# Patient Record
Sex: Female | Born: 1955 | Race: Black or African American | Hispanic: No | Marital: Married | Smoking: Former smoker
Health system: Southern US, Community
[De-identification: ages and names within clinical notes are randomized; demographics above are authoritative.]

## PROBLEM LIST (undated history)

## (undated) DIAGNOSIS — M797 Fibromyalgia: Secondary | ICD-10-CM

## (undated) DIAGNOSIS — K219 Gastro-esophageal reflux disease without esophagitis: Secondary | ICD-10-CM

## (undated) DIAGNOSIS — D689 Coagulation defect, unspecified: Secondary | ICD-10-CM

## (undated) DIAGNOSIS — E785 Hyperlipidemia, unspecified: Secondary | ICD-10-CM

## (undated) DIAGNOSIS — F419 Anxiety disorder, unspecified: Secondary | ICD-10-CM

## (undated) DIAGNOSIS — R1010 Upper abdominal pain, unspecified: Secondary | ICD-10-CM

## (undated) DIAGNOSIS — F32A Depression, unspecified: Secondary | ICD-10-CM

## (undated) DIAGNOSIS — M1990 Unspecified osteoarthritis, unspecified site: Secondary | ICD-10-CM

## (undated) DIAGNOSIS — I2699 Other pulmonary embolism without acute cor pulmonale: Secondary | ICD-10-CM

## (undated) DIAGNOSIS — F329 Major depressive disorder, single episode, unspecified: Secondary | ICD-10-CM

## (undated) DIAGNOSIS — H269 Unspecified cataract: Secondary | ICD-10-CM

## (undated) DIAGNOSIS — G709 Myoneural disorder, unspecified: Secondary | ICD-10-CM

## (undated) HISTORY — PX: UPPER GI ENDOSCOPY: SHX6162

## (undated) HISTORY — DX: Depression, unspecified: F32.A

## (undated) HISTORY — PX: TONSILLECTOMY: SUR1361

## (undated) HISTORY — DX: Fibromyalgia: M79.7

## (undated) HISTORY — DX: Unspecified cataract: H26.9

## (undated) HISTORY — PX: POLYPECTOMY: SHX149

## (undated) HISTORY — DX: Major depressive disorder, single episode, unspecified: F32.9

## (undated) HISTORY — DX: Myoneural disorder, unspecified: G70.9

## (undated) HISTORY — DX: Anxiety disorder, unspecified: F41.9

## (undated) HISTORY — DX: Upper abdominal pain, unspecified: R10.10

## (undated) HISTORY — PX: COLONOSCOPY: SHX174

## (undated) HISTORY — PX: BACK SURGERY: SHX140

## (undated) HISTORY — PX: ABDOMINAL HYSTERECTOMY: SHX81

## (undated) HISTORY — PX: UPPER GASTROINTESTINAL ENDOSCOPY: SHX188

## (undated) HISTORY — PX: BUNIONECTOMY: SHX129

## (undated) HISTORY — DX: Unspecified osteoarthritis, unspecified site: M19.90

## (undated) HISTORY — DX: Gastro-esophageal reflux disease without esophagitis: K21.9

## (undated) HISTORY — DX: Hyperlipidemia, unspecified: E78.5

## (undated) HISTORY — DX: Other pulmonary embolism without acute cor pulmonale: I26.99

## (undated) HISTORY — DX: Coagulation defect, unspecified: D68.9

---

## 1998-05-02 ENCOUNTER — Emergency Department (HOSPITAL_COMMUNITY): Admission: EM | Admit: 1998-05-02 | Discharge: 1998-05-02 | Payer: Self-pay | Admitting: Emergency Medicine

## 1998-05-03 ENCOUNTER — Encounter: Admission: RE | Admit: 1998-05-03 | Discharge: 1998-05-03 | Payer: Self-pay | Admitting: Internal Medicine

## 1998-05-05 ENCOUNTER — Emergency Department (HOSPITAL_COMMUNITY): Admission: EM | Admit: 1998-05-05 | Discharge: 1998-05-05 | Payer: Self-pay | Admitting: Emergency Medicine

## 1998-05-07 ENCOUNTER — Emergency Department (HOSPITAL_COMMUNITY): Admission: EM | Admit: 1998-05-07 | Discharge: 1998-05-07 | Payer: Self-pay | Admitting: Emergency Medicine

## 1998-06-28 ENCOUNTER — Inpatient Hospital Stay (HOSPITAL_COMMUNITY): Admission: RE | Admit: 1998-06-28 | Discharge: 1998-06-29 | Payer: Self-pay | Admitting: Neurosurgery

## 1998-06-28 ENCOUNTER — Encounter: Payer: Self-pay | Admitting: Neurosurgery

## 1998-07-22 ENCOUNTER — Ambulatory Visit (HOSPITAL_COMMUNITY): Admission: RE | Admit: 1998-07-22 | Discharge: 1998-07-22 | Payer: Self-pay | Admitting: Neurosurgery

## 1998-07-22 ENCOUNTER — Encounter: Payer: Self-pay | Admitting: Neurosurgery

## 1998-08-22 ENCOUNTER — Encounter: Payer: Self-pay | Admitting: Neurosurgery

## 1998-08-22 ENCOUNTER — Ambulatory Visit (HOSPITAL_COMMUNITY): Admission: RE | Admit: 1998-08-22 | Discharge: 1998-08-22 | Payer: Self-pay | Admitting: Neurosurgery

## 2000-08-08 ENCOUNTER — Encounter: Payer: Self-pay | Admitting: Emergency Medicine

## 2000-08-08 ENCOUNTER — Emergency Department (HOSPITAL_COMMUNITY): Admission: EM | Admit: 2000-08-08 | Discharge: 2000-08-08 | Payer: Self-pay | Admitting: Emergency Medicine

## 2009-01-31 ENCOUNTER — Encounter: Admission: RE | Admit: 2009-01-31 | Discharge: 2009-01-31 | Payer: Self-pay | Admitting: Neurosurgery

## 2009-02-15 ENCOUNTER — Ambulatory Visit (HOSPITAL_COMMUNITY): Admission: RE | Admit: 2009-02-15 | Discharge: 2009-02-15 | Payer: Self-pay | Admitting: Neurosurgery

## 2010-12-21 DIAGNOSIS — I2699 Other pulmonary embolism without acute cor pulmonale: Secondary | ICD-10-CM

## 2010-12-21 HISTORY — DX: Other pulmonary embolism without acute cor pulmonale: I26.99

## 2011-01-03 DIAGNOSIS — D689 Coagulation defect, unspecified: Secondary | ICD-10-CM

## 2011-01-03 HISTORY — DX: Coagulation defect, unspecified: D68.9

## 2011-01-06 ENCOUNTER — Observation Stay (HOSPITAL_COMMUNITY)
Admission: EM | Admit: 2011-01-06 | Discharge: 2011-01-08 | Disposition: A | Discharge status: Home / Self Care | Payer: BC Managed Care – PPO | Attending: Internal Medicine | Admitting: Internal Medicine

## 2011-01-06 ENCOUNTER — Emergency Department (HOSPITAL_COMMUNITY): Payer: BC Managed Care – PPO

## 2011-01-06 DIAGNOSIS — F17200 Nicotine dependence, unspecified, uncomplicated: Secondary | ICD-10-CM | POA: Insufficient documentation

## 2011-01-06 DIAGNOSIS — J90 Pleural effusion, not elsewhere classified: Secondary | ICD-10-CM | POA: Insufficient documentation

## 2011-01-06 DIAGNOSIS — I2699 Other pulmonary embolism without acute cor pulmonale: Principal | ICD-10-CM | POA: Insufficient documentation

## 2011-01-06 DIAGNOSIS — R0989 Other specified symptoms and signs involving the circulatory and respiratory systems: Secondary | ICD-10-CM | POA: Insufficient documentation

## 2011-01-06 DIAGNOSIS — R110 Nausea: Secondary | ICD-10-CM | POA: Insufficient documentation

## 2011-01-06 DIAGNOSIS — R0609 Other forms of dyspnea: Secondary | ICD-10-CM | POA: Insufficient documentation

## 2011-01-06 DIAGNOSIS — F411 Generalized anxiety disorder: Secondary | ICD-10-CM | POA: Insufficient documentation

## 2011-01-06 LAB — CBC
HCT: 38.8 % (ref 36.0–46.0)
Hemoglobin: 12.5 g/dL (ref 12.0–15.0)
MCV: 90 fL (ref 78.0–100.0)
Platelets: 166 10*3/uL (ref 150–400)
RBC: 4.31 MIL/uL (ref 3.87–5.11)
WBC: 7.9 10*3/uL (ref 4.0–10.5)

## 2011-01-06 LAB — POCT CARDIAC MARKERS
CKMB, poc: <1 ng/mL — ABNORMAL LOW (ref 1.0–8.0)
Troponin i, poc: <0.05 ng/mL (ref 0.00–0.09)

## 2011-01-06 LAB — DIFFERENTIAL
Eosinophils Absolute: 0.1 10*3/uL (ref 0.0–0.7)
Lymphocytes Relative: 23 % (ref 12–46)
Lymphs Abs: 1.8 10*3/uL (ref 0.7–4.0)
Neutro Abs: 5.5 10*3/uL (ref 1.7–7.7)
Neutrophils Relative %: 70 % (ref 43–77)

## 2011-01-06 LAB — COMPREHENSIVE METABOLIC PANEL
Albumin: 3.3 g/dL — ABNORMAL LOW (ref 3.5–5.2)
Alkaline Phosphatase: 71 U/L (ref 39–117)
BUN: 10 mg/dL (ref 6–23)
Potassium: 3.7 mEq/L (ref 3.5–5.1)
Total Protein: 6.8 g/dL (ref 6.0–8.3)

## 2011-01-07 ENCOUNTER — Inpatient Hospital Stay (HOSPITAL_COMMUNITY): Payer: BC Managed Care – PPO

## 2011-01-07 DIAGNOSIS — I2699 Other pulmonary embolism without acute cor pulmonale: Secondary | ICD-10-CM

## 2011-01-07 DIAGNOSIS — I517 Cardiomegaly: Secondary | ICD-10-CM

## 2011-01-07 LAB — POCT CARDIAC MARKERS
CKMB, poc: <1 ng/mL — ABNORMAL LOW (ref 1.0–8.0)
Myoglobin, poc: 49.7 ng/mL (ref 12–200)
Troponin i, poc: <0.05 ng/mL (ref 0.00–0.09)

## 2011-01-07 LAB — CARDIAC PANEL(CRET KIN+CKTOT+MB+TROPI)
CK, MB: 0.9 ng/mL (ref 0.3–4.0)
CK, MB: 0.8 ng/mL (ref 0.3–4.0)
CK, MB: 0.7 ng/mL (ref 0.3–4.0)
Relative Index: INVALID (ref 0.0–2.5)
Relative Index: INVALID (ref 0.0–2.5)
Total CK: 80 U/L (ref 7–177)
Total CK: 78 U/L (ref 7–177)
Troponin I: 0.01 ng/mL (ref 0.00–0.06)

## 2011-01-07 LAB — LIPID PANEL
HDL: 42 mg/dL
Total CHOL/HDL Ratio: 3.7 ratio
Triglycerides: 51 mg/dL
VLDL: 10 mg/dL (ref 0–40)

## 2011-01-07 LAB — COMPREHENSIVE METABOLIC PANEL
ALT: 18 U/L (ref 0–35)
CO2: 27 mEq/L (ref 19–32)
Calcium: 9 mg/dL (ref 8.4–10.5)
Creatinine, Ser: 0.75 mg/dL (ref 0.4–1.2)
GFR calc non Af Amer: >60 mL/min (ref >60)
Glucose, Bld: 90 mg/dL (ref 70–99)
Sodium: 139 mEq/L (ref 135–145)
Total Bilirubin: 0.6 mg/dL (ref 0.3–1.2)

## 2011-01-07 LAB — URINALYSIS, ROUTINE W REFLEX MICROSCOPIC
Glucose, UA: NEGATIVE mg/dL
Hgb urine dipstick: NEGATIVE
Specific Gravity, Urine: 1.014 (ref 1.005–1.030)

## 2011-01-07 LAB — CBC
HCT: 40.5 % (ref 36.0–46.0)
Hemoglobin: 13 g/dL (ref 12.0–15.0)
MCH: 29 pg (ref 26.0–34.0)
MCHC: 32.1 g/dL (ref 30.0–36.0)
MCV: 90.2 fL (ref 78.0–100.0)
Platelets: 177 K/uL (ref 150–400)
RBC: 4.49 MIL/uL (ref 3.87–5.11)
RDW: 13.6 % (ref 11.5–15.5)
WBC: 6.3 K/uL (ref 4.0–10.5)

## 2011-01-07 LAB — URINE MICROSCOPIC-ADD ON

## 2011-01-07 LAB — MAGNESIUM: Magnesium: 2.3 mg/dL (ref 1.5–2.5)

## 2011-01-07 LAB — TSH: TSH: 3.034 u[IU]/mL (ref 0.350–4.500)

## 2011-01-07 MED ORDER — IOHEXOL 300 MG/ML  SOLN
100.0000 mL | Freq: Once | INTRAMUSCULAR | Status: AC | PRN
  Administered 2011-01-07: 100 mL via INTRAVENOUS

## 2011-01-08 LAB — DIFFERENTIAL
Basophils Relative: 1 % (ref 0–1)
Lymphocytes Relative: 40 % (ref 12–46)
Lymphs Abs: 2.2 10*3/uL (ref 0.7–4.0)
Monocytes Absolute: 0.4 10*3/uL (ref 0.1–1.0)
Monocytes Relative: 8 % (ref 3–12)
Neutro Abs: 2.8 10*3/uL (ref 1.7–7.7)

## 2011-01-08 LAB — COMPREHENSIVE METABOLIC PANEL
AST: 17 U/L (ref 0–37)
BUN: 8 mg/dL (ref 6–23)
CO2: 28 mEq/L (ref 19–32)
Creatinine, Ser: 0.71 mg/dL (ref 0.4–1.2)
GFR calc non Af Amer: >60 mL/min (ref >60)
Glucose, Bld: 89 mg/dL (ref 70–99)
Potassium: 4.1 mEq/L (ref 3.5–5.1)
Sodium: 141 mEq/L (ref 135–145)
Total Bilirubin: 0.6 mg/dL (ref 0.3–1.2)

## 2011-01-08 LAB — CBC
HCT: 40.2 % (ref 36.0–46.0)
Hemoglobin: 12.9 g/dL (ref 12.0–15.0)
MCH: 29.1 pg (ref 26.0–34.0)
MCHC: 32.1 g/dL (ref 30.0–36.0)
MCV: 90.5 fL (ref 78.0–100.0)
RBC: 4.44 MIL/uL (ref 3.87–5.11)
WBC: 5.7 10*3/uL (ref 4.0–10.5)

## 2011-01-08 LAB — URINE CULTURE: Colony Count: NO GROWTH

## 2011-01-08 LAB — PROTIME-INR: Prothrombin Time: 13.4 seconds (ref 11.6–15.2)

## 2011-01-08 LAB — MAGNESIUM: Magnesium: 2.3 mg/dL (ref 1.5–2.5)

## 2011-01-15 NOTE — Discharge Summary (Signed)
Alexa Simpson, Alexa Simpson                ACCOUNT NO.:  [id number]  MEDICAL RECORD NO.:  [id number]           PATIENT TYPE:  I  LOCATION:  2002                         FACILITY:  [facility]  PHYSICIAN:  Rock Nephew, MD       DATE OF BIRTH:  [birth year]  DATE OF ADMISSION:  01/06/2011 DATE OF DISCHARGE:  01/08/2011                        DISCHARGE SUMMARY - REFERRING   The patient does not have a primary care physician, but the patient is being set up with Delsa Sale, nurse practitioner, phone number [phone].  DISCHARGE DIAGNOSES: 1. Acute pulmonary embolism. 2. Anxiety. 3. Also reactive nausea. 4. Tobacco abuse.  DISCHARGE MEDICATIONS:  For the patient are as follows: 1. Lovenox 80 mg subcutaneously twice daily for 5 days. 2. Vicodin 1 tablet by mouth every 4 hours as needed. 3. Zofran 4 mg by mouth every 6 hours as needed. 4. Warfarin 5 mg p.o. daily. 5. Multivitamins 1 tablet p.o. daily.  DISPOSITION:  The patient is discharged home.  DIET:  Regular.  PROCEDURES PERFORMED:  The patient had a CT angiogram of the chest which showed several medium-sized bilateral lower lobe pulmonary emboli. Small left pleural effusion and bibasilar atelectasis.  Chest x-ray, January 06, 2011, showed lingular atelectasis or scar.  No acute cardiopulmonary disease, otherwise.  The patient had bilateral lower extremity Dopplers which were negative for DVT.  The patient had a 2-D echocardiogram on January 07, 2011, which showed left ventricle ejection fraction of 65-70%.  Trivial mitral regurg, left atrium mildly dilated, right ventricle cavity size is normal and systolic function was normal. Pulmonary artery pressure could not be calculated.  There was no strain pattern.  CONSULTATIONS ON THIS CASE:  None.  FOLLOWUP:  The patient should follow up with Delsa Sale on January 12, 2011, at 10:15 a.m. The patient should also have PT/INR drawn and checking at that time.  The patient should also follow up  with a hematologist about 1-2 months to evaluate the reason for the PE and length of therapy for Coumadin, is anticipated.  The patient will need at least 6 months of Coumadin.  BRIEF HISTORY OF PRESENT ILLNESS:  This is a 55 year old female with a history tobacco abuse who presents complaining of chest pain, which has been going on for the last 48 hours.  Chest pain is on left side, retrosternal.  The patient had chest x-ray which showed no acute changes.  HOSPITAL COURSE: 1. Acute pulmonary embolism.  The patient had acute pulmonary     embolism, etiology is not clear.  The patient does use tobacco     abuse, but the patient does not appear to have any risk factors for     the PE.  The patient had a CT angiogram of the chest which showed     the PE.  The patient has been started on Lovenox and Coumadin after     the PE was found.  The patient had Lovenox teaching as well as     Coumadin teaching.  The patient will be sent home on Lovenox bridge     with Coumadin.  She should have  PT/INR checked on January 12, 2011.     She should follow up with hematologist in 1-2 months. 2. Tobacco abuse.  The patient was counseled against tobacco abuse. 3. Anxiety.  The patient's anxiety is stable. 4. Nausea, chest discomfort.  The patient had some nausea and chest     discomfort, which is most likely related to the PE, which should     resolve with time.  The patient was given pain medications as well     as Zofran as needed.     Rock Nephew, MD     NH/MEDQ  D:  01/08/2011  T:  01/08/2011  Job:  [id number]  Electronically Signed by Rock Nephew MD on 01/15/2011 09:41:23 PM

## 2011-02-01 NOTE — H&P (Signed)
Alexa Simpson, Alexa Simpson                ACCOUNT NO.:  [id number]  MEDICAL RECORD NO.:  [id number]           PATIENT TYPE:  LOCATION:                                 FACILITY:  PHYSICIAN:  Eduard Clos, MDDATE OF BIRTH:  24-Aug-1956  DATE OF ADMISSION: DATE OF DISCHARGE:                             HISTORY & PHYSICAL   PRIMARY CARE PHYSICIAN:  Dr. Lia Hopping.  CHIEF COMPLAINT:  Chest pain.  HISTORY OF PRESENT ILLNESS:  A 55 year old female with known history of tobacco abuse presents complaining of chest pain which has been there for last 48 hours.  The pain is more on the left side of anterior chest wall, also retrosternal.  Lasted for few minutes, increased with deep inspiration, and is becoming more constant at this time.  EKG and cardiac enzymes are negative.  Chest x-ray did not show anything acute. The patient has been admitted for chest pain rule out.  The patient denies any nausea, vomiting, or abdominal pain.  Denies any fever, chills, cough, or sputum.  Denies any dysuria, discharge, diarrhea.  PAST MEDICAL HISTORY:  History of ongoing tobacco abuse.  PAST SURGICAL HISTORY:  Cervical disk surgery and hysterectomy.  MEDICATIONS PRIOR TO ADMISSION:  Multivitamins.  FAMILY HISTORY:  Significant for coronary artery disease and strokes.  SOCIAL HISTORY:  The patient smokes cigarettes, has been advised to quit smoking.  Drinks alcohol very occasionally, once a month, last drink was 2 weeks ago.  Denies any drug abuse.  ALLERGIES:  No known drug allergies.  REVIEW OF SYSTEMS:  As per history of present illness, nothing else significant.  PHYSICAL EXAMINATION:  GENERAL:  The patient examined at bedside, not in acute distress. VITAL SIGNS:  Blood pressure is 105/60, pulse 60 per minute, temperature 97.8, respirations 18 per minute, O2 99%. HEENT:  Anicteric.  No pallor.  No facial asymmetry.  Tongue is midline. CHEST:  Bilateral air entry present.  No rhonchi.   No crepitation. HEART:  S1 and S2 heard. ABDOMEN:  Soft, nontender.  Bowel sounds heard. CNS:  The patient alert, awake, oriented to time, place, and person. Moves upper and lower extremities 5/5. EXTREMITIES:  Peripheral pulses felt.  No edema.  LABORATORY DATA:  EKG shows normal sinus rhythm, low voltage, and heart rate is around 88 beats per minute with nonspecific ST-T changes.  Chest x-ray shows lingular atelectasis of scar.  No acute cardiopulmonary disease.  CBC, WBC 7.9, hemoglobin is 12.5, hematocrit is 38.8, platelets 166.  Complete metabolic panel, sodium 140, potassium 2.7, chloride 105, carbon dioxide 27, glucose 104, BUN 10, creatinine 0.7. Total bilirubin is 0.5, alk phos is 71, AST 19, ALT 18.  Calcium 9. Troponin less than 0.3 x2, CK-MB is less than 1 x2, myoglobin is 44.7 and 29.7.  UA shows trace leukocyte, wbc 0-6.  ASSESSMENT: 1. Chest pain rule out acute coronary syndrome. 2. Possible urinary tract infection. 3. Possible anxiety and depression with no suicidal ideation. 4. Tobacco abuse.  PLAN: 1. At this time, we will admit the patient to telemetry. 2. For chest pain, get CT angio of chest, cycle cardiac markers,  get 2-     D echo. 3. The patient will need tobacco abuse cessation counseling. 4. The patient does have some signs of anxiety and depression.  At     this time, she has no suicidal ideation, but eventually may need     antidepressants and antianxiety meds.     Eduard Clos, MD     ANK/MEDQ  D:  01/07/2011  T:  01/07/2011  Job:  [id number]  Electronically Signed by Midge Minium MD on 02/01/2011 08:14:17 AM

## 2011-03-05 ENCOUNTER — Other Ambulatory Visit: Payer: Self-pay | Admitting: Internal Medicine

## 2011-03-05 DIAGNOSIS — R071 Chest pain on breathing: Secondary | ICD-10-CM

## 2011-03-06 ENCOUNTER — Ambulatory Visit
Admission: RE | Admit: 2011-03-06 | Discharge: 2011-03-06 | Disposition: A | Discharge status: Home / Self Care | Payer: BC Managed Care – PPO | Source: Ambulatory Visit | Attending: Internal Medicine | Admitting: Internal Medicine

## 2011-03-06 DIAGNOSIS — R071 Chest pain on breathing: Secondary | ICD-10-CM

## 2011-03-06 MED ORDER — IOHEXOL 300 MG/ML  SOLN
100.0000 mL | Freq: Once | INTRAMUSCULAR | Status: AC | PRN
  Administered 2011-03-06: 100 mL via INTRAVENOUS

## 2011-04-10 ENCOUNTER — Other Ambulatory Visit: Payer: Self-pay | Admitting: Cardiology

## 2011-04-10 DIAGNOSIS — I714 Abdominal aortic aneurysm, without rupture, unspecified: Secondary | ICD-10-CM

## 2011-04-17 ENCOUNTER — Ambulatory Visit
Admission: RE | Admit: 2011-04-17 | Discharge: 2011-04-17 | Disposition: A | Discharge status: Home / Self Care | Payer: BC Managed Care – PPO | Source: Ambulatory Visit | Attending: Cardiology | Admitting: Cardiology

## 2011-04-17 DIAGNOSIS — I714 Abdominal aortic aneurysm, without rupture, unspecified: Secondary | ICD-10-CM

## 2011-05-19 ENCOUNTER — Other Ambulatory Visit: Payer: Self-pay | Admitting: Adult Health Nurse Practitioner

## 2011-05-19 DIAGNOSIS — Z1231 Encounter for screening mammogram for malignant neoplasm of breast: Secondary | ICD-10-CM

## 2011-06-08 ENCOUNTER — Ambulatory Visit
Admission: RE | Admit: 2011-06-08 | Discharge: 2011-06-08 | Disposition: A | Discharge status: Home / Self Care | Payer: BC Managed Care – PPO | Source: Ambulatory Visit | Attending: Adult Health Nurse Practitioner | Admitting: Adult Health Nurse Practitioner

## 2011-06-08 DIAGNOSIS — Z1231 Encounter for screening mammogram for malignant neoplasm of breast: Secondary | ICD-10-CM

## 2011-07-15 ENCOUNTER — Encounter: Payer: BC Managed Care – PPO | Admitting: Hematology and Oncology

## 2011-07-24 ENCOUNTER — Other Ambulatory Visit: Payer: Self-pay | Admitting: Hematology and Oncology

## 2011-07-24 ENCOUNTER — Encounter: Payer: Self-pay | Admitting: Gastroenterology

## 2011-07-24 ENCOUNTER — Encounter (HOSPITAL_BASED_OUTPATIENT_CLINIC_OR_DEPARTMENT_OTHER): Payer: BC Managed Care – PPO | Admitting: Hematology and Oncology

## 2011-07-24 DIAGNOSIS — I2699 Other pulmonary embolism without acute cor pulmonale: Secondary | ICD-10-CM

## 2011-07-24 DIAGNOSIS — Z7901 Long term (current) use of anticoagulants: Secondary | ICD-10-CM

## 2011-07-24 DIAGNOSIS — R109 Unspecified abdominal pain: Secondary | ICD-10-CM

## 2011-07-24 DIAGNOSIS — I82629 Acute embolism and thrombosis of deep veins of unspecified upper extremity: Secondary | ICD-10-CM

## 2011-07-24 LAB — CBC WITH DIFFERENTIAL/PLATELET
BASO%: 0.5 % (ref 0.0–2.0)
Basophils Absolute: 0 10*3/uL (ref 0.0–0.1)
EOS%: 2.5 % (ref 0.0–7.0)
Eosinophils Absolute: 0.2 10*3/uL (ref 0.0–0.5)
HCT: 45.1 % (ref 34.8–46.6)
HGB: 14.6 g/dL (ref 11.6–15.9)
LYMPH%: 45.6 % (ref 14.0–49.7)
MCH: 28.5 pg (ref 25.1–34.0)
MCHC: 32.4 g/dL (ref 31.5–36.0)
MCV: 87.9 fL (ref 79.5–101.0)
MONO#: 0.5 10*3/uL (ref 0.1–0.9)
MONO%: 5.2 % (ref 0.0–14.0)
NEUT#: 4.1 10*3/uL (ref 1.5–6.5)
NEUT%: 46.2 % (ref 38.4–76.8)
Platelets: 223 10*3/uL (ref 145–400)
RBC: 5.13 10*6/uL (ref 3.70–5.45)
RDW: 13.7 % (ref 11.2–14.5)
WBC: 8.8 10*3/uL (ref 3.9–10.3)
lymph#: 4 10*3/uL — ABNORMAL HIGH (ref 0.9–3.3)
nRBC: 0 % (ref 0–0)

## 2011-07-24 LAB — COMPREHENSIVE METABOLIC PANEL WITH GFR
ALT: 34 U/L (ref 0–35)
AST: 25 U/L (ref 0–37)
Albumin: 3.9 g/dL (ref 3.5–5.2)
Alkaline Phosphatase: 85 U/L (ref 39–117)
BUN: 17 mg/dL (ref 6–23)
CO2: 29 meq/L (ref 19–32)
Calcium: 10.2 mg/dL (ref 8.4–10.5)
Chloride: 99 meq/L (ref 96–112)
Creatinine, Ser: 0.85 mg/dL (ref 0.50–1.10)
Glucose, Bld: 84 mg/dL (ref 70–99)
Potassium: 3.9 meq/L (ref 3.5–5.3)
Sodium: 137 meq/L (ref 135–145)
Total Bilirubin: 0.2 mg/dL — ABNORMAL LOW (ref 0.3–1.2)
Total Protein: 8 g/dL (ref 6.0–8.3)

## 2011-07-29 ENCOUNTER — Ambulatory Visit (HOSPITAL_COMMUNITY)
Admission: RE | Admit: 2011-07-29 | Discharge: 2011-07-29 | Disposition: A | Discharge status: Home / Self Care | Payer: BC Managed Care – PPO | Source: Ambulatory Visit | Attending: Hematology and Oncology | Admitting: Hematology and Oncology

## 2011-07-29 ENCOUNTER — Telehealth: Payer: Self-pay | Admitting: *Deleted

## 2011-07-29 DIAGNOSIS — R109 Unspecified abdominal pain: Secondary | ICD-10-CM | POA: Insufficient documentation

## 2011-07-29 DIAGNOSIS — Z86711 Personal history of pulmonary embolism: Secondary | ICD-10-CM | POA: Insufficient documentation

## 2011-07-29 DIAGNOSIS — M545 Low back pain, unspecified: Secondary | ICD-10-CM | POA: Insufficient documentation

## 2011-07-29 DIAGNOSIS — K5730 Diverticulosis of large intestine without perforation or abscess without bleeding: Secondary | ICD-10-CM | POA: Insufficient documentation

## 2011-07-29 DIAGNOSIS — K7689 Other specified diseases of liver: Secondary | ICD-10-CM | POA: Insufficient documentation

## 2011-07-29 DIAGNOSIS — Z90710 Acquired absence of both cervix and uterus: Secondary | ICD-10-CM | POA: Insufficient documentation

## 2011-07-29 DIAGNOSIS — N281 Cyst of kidney, acquired: Secondary | ICD-10-CM | POA: Insufficient documentation

## 2011-07-29 DIAGNOSIS — I2699 Other pulmonary embolism without acute cor pulmonale: Secondary | ICD-10-CM

## 2011-07-29 MED ORDER — IOHEXOL 300 MG/ML  SOLN
100.0000 mL | Freq: Once | INTRAMUSCULAR | Status: AC | PRN
  Administered 2011-07-29: 100 mL via INTRAVENOUS

## 2011-07-30 ENCOUNTER — Telehealth: Payer: Self-pay | Admitting: *Deleted

## 2011-07-30 NOTE — Telephone Encounter (Signed)
Dr. Dalene Carrow reviewed CT scan results done  07/29/11.    Spoke with pt on cell phone and informed pt re:   CT scan looks good  As per md.    Informed pt that  A scheduler will contact pt with date and time for f/u in 2013.   Pt voiced understanding.

## 2011-07-30 NOTE — Telephone Encounter (Signed)
See comments  

## 2011-08-17 ENCOUNTER — Encounter: Payer: Self-pay | Admitting: *Deleted

## 2011-08-18 ENCOUNTER — Other Ambulatory Visit: Payer: Self-pay | Admitting: Hematology and Oncology

## 2011-08-18 ENCOUNTER — Ambulatory Visit (INDEPENDENT_AMBULATORY_CARE_PROVIDER_SITE_OTHER): Payer: BC Managed Care – PPO | Admitting: Gastroenterology

## 2011-08-18 ENCOUNTER — Encounter: Payer: Self-pay | Admitting: Gastroenterology

## 2011-08-18 ENCOUNTER — Encounter: Payer: Self-pay | Admitting: *Deleted

## 2011-08-18 ENCOUNTER — Telehealth: Payer: Self-pay | Admitting: Gastroenterology

## 2011-08-18 DIAGNOSIS — R1319 Other dysphagia: Secondary | ICD-10-CM

## 2011-08-18 DIAGNOSIS — Z7901 Long term (current) use of anticoagulants: Secondary | ICD-10-CM

## 2011-08-18 DIAGNOSIS — Z8371 Family history of colonic polyps: Secondary | ICD-10-CM

## 2011-08-18 DIAGNOSIS — R109 Unspecified abdominal pain: Secondary | ICD-10-CM | POA: Insufficient documentation

## 2011-08-18 DIAGNOSIS — R682 Dry mouth, unspecified: Secondary | ICD-10-CM

## 2011-08-18 DIAGNOSIS — R1310 Dysphagia, unspecified: Secondary | ICD-10-CM

## 2011-08-18 DIAGNOSIS — Z86711 Personal history of pulmonary embolism: Secondary | ICD-10-CM | POA: Insufficient documentation

## 2011-08-18 DIAGNOSIS — R142 Eructation: Secondary | ICD-10-CM

## 2011-08-18 DIAGNOSIS — R140 Abdominal distension (gaseous): Secondary | ICD-10-CM

## 2011-08-18 DIAGNOSIS — R1314 Dysphagia, pharyngoesophageal phase: Secondary | ICD-10-CM

## 2011-08-18 DIAGNOSIS — K219 Gastro-esophageal reflux disease without esophagitis: Secondary | ICD-10-CM

## 2011-08-18 DIAGNOSIS — Z889 Allergy status to unspecified drugs, medicaments and biological substances status: Secondary | ICD-10-CM

## 2011-08-18 DIAGNOSIS — K117 Disturbances of salivary secretion: Secondary | ICD-10-CM

## 2011-08-18 DIAGNOSIS — R141 Gas pain: Secondary | ICD-10-CM

## 2011-08-18 DIAGNOSIS — K5730 Diverticulosis of large intestine without perforation or abscess without bleeding: Secondary | ICD-10-CM

## 2011-08-18 MED ORDER — METOCLOPRAMIDE HCL 10 MG PO TABS: 10.0000 mg | ORAL_TABLET | Freq: Every day | ORAL | Status: DC

## 2011-08-18 MED ORDER — ESOMEPRAZOLE MAGNESIUM 40 MG PO CPDR: 40.0000 mg | DELAYED_RELEASE_CAPSULE | Freq: Two times a day (BID) | ORAL | Status: DC

## 2011-08-18 MED ORDER — PEG-KCL-NACL-NASULF-NA ASC-C 100 G PO SOLR: 1.0000 | Freq: Once | ORAL | Status: DC

## 2011-08-18 NOTE — Telephone Encounter (Signed)
Received 3 pages from [facility]. Forwarded to Dr. Jarold Motto for review. 08/18/11-ar

## 2011-08-18 NOTE — Patient Instructions (Signed)
Your procedure has been scheduled for 08/31/2011, please follow the seperate instructions.  Your prescription(s) have been sent to you pharmacy.  Today you watched the movie on gerd. Follow the artificial handout sheet.  Take Reglan at bedtime.  Take Nexium one tablet by mouth twice a day.

## 2011-08-18 NOTE — Progress Notes (Signed)
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 She is referred by Dr. Dorothyann Peng for evaluation of chronic acid reflux with progressive solid food dysphagia in her distal substernal area. She currently is all Nexium 40 mg daily, but only uses this when necessary because of expense. She has severe belching, burping, abdominal gas, bloating, and some distention. She had an acute pulmonary embolus in April of 2012 and has been on Coumadin since that time. She denies any lower gastrointestinal problems, melena, hematochezia, or bowel irregularity. She does have a brother with colon polyps. Patient has not had problems with anemia, and I reviewed her extensive records including an excellent note from Dr.Ogogwu/ hematology per her coagulation difficulties. The patient has not had previous colonoscopy, endoscopy, or barium studies. CT scan of the abdomen in November showed diverticulosis coli but no other abnormalities. There is no history of pancreatitis, hepatitis, or gallbladder disease. The patient does have dry etes and dry mouth, chews a lot of gum and hard candy. She denies a history of lactose intolerance. Patient does describe early satiety, belching, burping, and frequent regurgitation. She also has chronic urinary incontinence and urinary frequency. Her appetite is good her weight is stable. He discontinued cigarette and alcohol use since her pulmonary embolus in April. She has severe urticaria with clarithromycin and omeprazole.  I have reviewed this patient's present history, medical and surgical past history, allergies and medications.     ROS: The remainder of the 10 point ROS is negative... she does complain of chronic low back pain, chronic anxiety syndrome, depression, frequent headaches, nonspecific itching, occasional light sweats, shortness of breath with exertion, severe urticaria with erythromycin and omeprazole, chronic insomnia,  excessive thirst and urination and urinary incontinency. She has had previous disc surgery on her back and a previous hysterectomy. Is not on hormonal replacement therapy.     Physical Exam: General well developed well nourished patient in no acute distress, appearing her stated age Eyes PERRLA, no icterus, fundoscopic exam per opthamologist Skin no lesions noted Neck supple, no adenopathy, no thyroid enlargement, no tenderness Chest clear to percussion and auscultation Heart no significant murmurs, gallops or rubs noted Abdomen no hepatosplenomegaly masses or tenderness, BS normal. There is a positive succussion splash in the epigastric area. Extremities no acute joint lesions, edema, phlebitis or evidence of cellulitis. Neurologic patient oriented x 3, cranial nerves intact, no focal neurologic deficits noted. Psychological mental status normal and normal affect.  Assessment and plan: Probable significant hiatal hernia with acid reflux and probable delayed gastric emptying. 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 Her oncologist-hematologist has suggested workup to exclude occult malignancy. I have scheduled her for an upper GI endoscopy, possible dilatation, and colonoscopy exam with appropriate adjustments in her Coumadin with Lovenox coverage as per Dr.Odogwu has suggested. This patient may have Sjogren's syndrome with associated decreased saliva production worsening her esophageal injury. Some of her gas and bloating probably related to ingestion nonadjustable carbohydrates. I have a laminated these from her diet, explained a  reflux regime, and we will proceed as planned. She also appears to have an element of delayed gastric emptying. I placed on Reglan 10 mg at bedtime and Nexium 40 mg twice a day before breakfast and supper. Otherwise she is to continue her medications as listed and reviewed.  Please copy her primary  care physician, referring physician, and pertinent subspecialists.  Encounter Diagnoses  Name Primary?  . Abdominal pain Yes  . GERD (gastroesophageal reflux disease)   . Esophageal dysphagia   . Dry mouth   . History of pulmonary embolism   . Anticoagulated on warfarin   . Abdominal bloating

## 2011-08-19 ENCOUNTER — Other Ambulatory Visit: Payer: Self-pay | Admitting: *Deleted

## 2011-08-19 ENCOUNTER — Telehealth: Payer: Self-pay | Admitting: *Deleted

## 2011-08-19 DIAGNOSIS — Z7901 Long term (current) use of anticoagulants: Secondary | ICD-10-CM

## 2011-08-19 MED ORDER — ENOXAPARIN SODIUM 120 MG/0.8ML ~~LOC~~ SOLN: 120.0000 mg | Freq: Every day | SUBCUTANEOUS | Status: AC

## 2011-08-19 NOTE — Telephone Encounter (Signed)
Spoke with pt at home and instructed pt re: 1.    STOP  Coumadin   On  08/25/11. 2.    Begin    Lovenox  120 mg  SQ  Daily  -  Starting  08/25/11   Through   08/30/11. 3.    Resume  Lovenox   120 mg   SQ  On  09/01/11   If  No bleeding   From   EGD and colonoscopy  Procedures  on  08/31/11. 4.    Pt to f/u with primary Dr. Allyne Gee for monitoring  PT/INR. Pt voiced understanding.   Per pt,  She had  All instructions  Written out.

## 2011-08-20 ENCOUNTER — Telehealth: Payer: Self-pay | Admitting: *Deleted

## 2011-08-20 NOTE — Telephone Encounter (Signed)
nexium needs PA for quanity we will contact pharmacy when we hear about the status

## 2011-08-24 ENCOUNTER — Telehealth: Payer: Self-pay | Admitting: Gastroenterology

## 2011-08-24 NOTE — Telephone Encounter (Signed)
[employer] and nexium is approved.

## 2011-08-31 ENCOUNTER — Encounter: Payer: Self-pay | Admitting: Gastroenterology

## 2011-08-31 ENCOUNTER — Ambulatory Visit (AMBULATORY_SURGERY_CENTER): Payer: BC Managed Care – PPO | Admitting: Gastroenterology

## 2011-08-31 DIAGNOSIS — K219 Gastro-esophageal reflux disease without esophagitis: Secondary | ICD-10-CM | POA: Insufficient documentation

## 2011-08-31 DIAGNOSIS — Z7901 Long term (current) use of anticoagulants: Secondary | ICD-10-CM

## 2011-08-31 DIAGNOSIS — R1314 Dysphagia, pharyngoesophageal phase: Secondary | ICD-10-CM

## 2011-08-31 DIAGNOSIS — R141 Gas pain: Secondary | ICD-10-CM

## 2011-08-31 DIAGNOSIS — R1319 Other dysphagia: Secondary | ICD-10-CM

## 2011-08-31 DIAGNOSIS — K2940 Chronic atrophic gastritis without bleeding: Secondary | ICD-10-CM

## 2011-08-31 DIAGNOSIS — K117 Disturbances of salivary secretion: Secondary | ICD-10-CM

## 2011-08-31 DIAGNOSIS — K5730 Diverticulosis of large intestine without perforation or abscess without bleeding: Secondary | ICD-10-CM

## 2011-08-31 DIAGNOSIS — R682 Dry mouth, unspecified: Secondary | ICD-10-CM

## 2011-08-31 DIAGNOSIS — K2950 Unspecified chronic gastritis without bleeding: Secondary | ICD-10-CM

## 2011-08-31 DIAGNOSIS — R109 Unspecified abdominal pain: Secondary | ICD-10-CM | POA: Insufficient documentation

## 2011-08-31 DIAGNOSIS — D126 Benign neoplasm of colon, unspecified: Secondary | ICD-10-CM

## 2011-08-31 DIAGNOSIS — R1310 Dysphagia, unspecified: Secondary | ICD-10-CM

## 2011-08-31 DIAGNOSIS — R142 Eructation: Secondary | ICD-10-CM

## 2011-08-31 DIAGNOSIS — R140 Abdominal distension (gaseous): Secondary | ICD-10-CM

## 2011-08-31 DIAGNOSIS — Z1211 Encounter for screening for malignant neoplasm of colon: Secondary | ICD-10-CM

## 2011-08-31 DIAGNOSIS — K222 Esophageal obstruction: Secondary | ICD-10-CM

## 2011-08-31 MED ORDER — SODIUM CHLORIDE 0.9 % IV SOLN: 500.0000 mL | INTRAVENOUS | Status: DC

## 2011-08-31 NOTE — Op Note (Signed)
[facility] [address] [place], [place]  [address]  COLONOSCOPY PROCEDURE REPORT  PATIENT:  Alexa Simpson, Alexa Simpson  MR#:  [id number] BIRTHDATE:  1955/12/18, 55 yrs. old  GENDER:  female ENDOSCOPIST:  Vania Rea. Jarold Motto, MD, [facility] REF. BY:  Dorothyann Peng, M.D. PROCEDURE DATE:  08/31/2011 PROCEDURE:  Colonoscopy with snare polypectomy, Colonoscopy for control of bleeding ASA CLASS:  Class III INDICATIONS:  Routine Risk Screening on lovenox bridge from coumadin. MEDICATIONS:   Fentanyl 75 mcg IV needs propofol for future exams., Versed 7 mg IV, These medications were titrated to patient response per physician's verbal order  DESCRIPTION OF PROCEDURE:   After the risks and benefits and of the procedure were explained, informed consent was obtained. Digital rectal exam was performed and revealed no abnormalities. The LB CF-H180AL [id number] endoscope was introduced through the anus and advanced to the cecum, which was identified by both the appendix and ileocecal valve.  The quality of the prep was excellent, using MoviPrep.  The instrument was then slowly withdrawn as the colon was fully examined. <<PROCEDUREIMAGES>>  FINDINGS:  ENDOSCOPIC FINDINGS:   A pedunculated polyp was found in the descending colon. .5-2.0 cm stalked polyp hot snare excised after 2cc of 1/10,000 epinephrine injected into polyp stalk. There were mild diverticular changes in left colon. diverticulosis was found.  A sessile polyp was found. 3 mm sessile sigmoid polyp cold biopsy removed.  This was otherwise a normal examination of the colon.   Retroflexed views in the rectum revealed no abnormalities.    The scope was then withdrawn from the patient and the procedure completed.  COMPLICATIONS:  None ENDOSCOPIC IMPRESSION: 1) Diverticulosis,mild,left sided diverticulosis 2) Pedunculated polyp in the descending colon 3) Sessile polyp 4) Otherwise normal examination RECOMMENDATIONS: 1) Repeat colonoscopy in  5 years if polyp adenomatous; otherwise 10 years 2) High fiber diet with liberal fluid intake. anticoag meds per HEMATOLOGY.  REPEAT EXAM:  No  ______________________________ Vania Rea. Jarold Motto, MD, [facility]  CC:  Si Gaul, MD  n. Rosalie Doctor:   Vania Rea. Madysun Thall at 08/31/2011 03:23 PM  Iran Sizer, [phone]

## 2011-08-31 NOTE — Patient Instructions (Signed)
Discharge instructions given with verbal understanding. Handouts on polyps,diverticulosis,gastritis,hiatal hernia,and a dilatation diet. Take Lovenox for the next three days then call Dr. Arbutus Ped about Coumadin.  Restart Coumadin if no bleeding occur. Resume all other previous medications.

## 2011-08-31 NOTE — Progress Notes (Signed)
Patient did not experience any of the following events: a burn prior to discharge; a fall within the facility; wrong site/side/patient/procedure/implant event; or a hospital transfer or hospital admission upon discharge from the facility. [phone]) Patient did not have preoperative order for IV antibiotic SSI prophylaxis. [phone]). Patient complain of left sided neck pain extending to ear. Dr. Jarold Motto notified of change in condition with instructions to gargle with salt and water. Also instructed may take something for the discomfort. Also instructed to call  Number on discharge instructions if symptoms worsen. Instructions given for Lovenox next three days call doctor if no bleeding to resume Coumadin.

## 2011-08-31 NOTE — Progress Notes (Signed)
Pt was on coumadin and it was d/c and put on Lovenox bridge.  Last dose of lovenox was 08/30/11 @16 :00.  Dr. Jarold Motto was made aware of this. Maw  Pt had cramping with the scope advancement.  Per Dr. Jarold Motto no more sedation at this time b/p 95/60.  IV wide open drip.  Once the cecum was reached the pt relaxed and rested comfortably. Maw  Injected the base of the descending polyp with total 2 ml of EPI before the polyp was removed by hot snare. maw  Hung second bag of N.S. 500 ml. Maw  Pt grabbing at the scope as Dr. Jarold Motto was removing the scope.  Dr. Jarold Motto passed a 71 maloney down her esophagus, when he pulled the Corona out the pt cried.  We explained the procedure was over and she did well.  Pt passing flatus post procedure. Maw  15:36 pt spit up mucous with small amount of heme present.  Dr. Jarold Motto made aware.  No orders given at this time. maw

## 2011-09-01 ENCOUNTER — Telehealth: Payer: Self-pay | Admitting: *Deleted

## 2011-09-01 DIAGNOSIS — K2940 Chronic atrophic gastritis without bleeding: Secondary | ICD-10-CM

## 2011-09-01 MED ORDER — LIDOCAINE VISCOUS 2 % MT SOLN: 20.0000 mL | OROMUCOSAL | Status: AC | PRN

## 2011-09-01 NOTE — Telephone Encounter (Signed)
Give her viscous Xylocaine to use every 2 hours when necessary. This problem should resolve

## 2011-09-01 NOTE — Telephone Encounter (Signed)
Prescription sent to pharmacy listed on pt chart. Called to inform pt, no answer, left message.

## 2011-09-01 NOTE — Op Note (Signed)
[facility] [address] [place], [place]  [address]  ENDOSCOPY PROCEDURE REPORT  PATIENT:  Alexa Simpson, Alexa Simpson  MR#:  [id number] BIRTHDATE:  October 04, 1955, 55 yrs. old  GENDER:  female  ENDOSCOPIST:  Vania Rea. Jarold Motto, MD, [place] Referred by:  Dorothyann Peng, M.D.  PROCEDURE DATE:  08/31/2011 PROCEDURE:  EGD with biopsy for H. pylori 19147, Maloney Dilation of Esophagus ASA CLASS:  Class III INDICATIONS:  GERD, dysphagia ON LOVENOX BRIDGE FOR COUMADIN RX.  MEDICATIONS:   There was residual sedation effect present from prior procedure., Versed 1 mg IV, These medications were titrated to patient response per physician's verbal order TOPICAL ANESTHETIC:  Cetacaine Spray  DESCRIPTION OF PROCEDURE:   After the risks and benefits of the procedure were explained, informed consent was obtained.  The LB GIF-H180 [id number] endoscope was introduced through the mouth and advanced to the second portion of the duodenum.  The instrument was slowly withdrawn as the mucosa was fully examined. <<PROCEDUREIMAGES>>  Mild gastritis was found in the antrum. CLO BX. DONE  Normal duodenal folds were noted.  A hiatal hernia was found. 4-5 CM HH NOTED SEE PICTURES.  The esophagus and gastroesophageal junction were completely normal in appearance. STRICTURE NOTED ON RETRO FLEXED VIEW.DILATED #80F MALONEY DILATOR.SOME SMALL AMT OF HEME. A stricture was found at the gastroesophageal junction. DILATED MALONEY DILATOR.#80F,,,    Retroflexed views revealed a hiatal hernia.    The scope was then withdrawn from the patient and the procedure completed.  COMPLICATIONS:  None  ENDOSCOPIC IMPRESSION: 1) Mild gastritis in the antrum 2) Normal duodenal folds 3) Hiatal hernia 4) Normal esophagus 5) Stricture at the gastroesophageal junction 1.R/O H.PYLORI GASTRITIS 2.GERD AND STRICTURE DILATED RECOMMENDATIONS: 1) Await biopsy results 2) Clear liquids until, then soft foods rest iof day. Resume prior diet  tomorrow. LOVENOX BRIDGE FOR 72H,THEN RESUME COUMADIN IF NO BLEEDING.S  ______________________________ Vania Rea Jarold Motto, MD, [facility]  CC:  Si Gaul, MD  n. Rosalie Doctor:   Vania Rea. Patterson at 08/31/2011 03:42 PM  Iran Sizer, [phone]

## 2011-09-01 NOTE — Telephone Encounter (Signed)
Follow up Call- Patient questions:  Do you have a fever, pain , or abdominal swelling? yes Pain Score  9 *  Have you tolerated food without any problems? no  Have you been able to return to your normal activities? no  Do you have any questions about your discharge instructions: Diet   no Medications  no Follow up visit  no  Do you have questions or concerns about your Care? yes  Actions: * If pain score is 4 or above: Physician/ provider Notified : Sheryn Bison, MD.  Pt states that she is having extreme pain in her throat radiating to her left ear when she swallows. Pt states that her pain is 9/10. Pt states that she is unable to swallow water. She also states that her husband gave her some ice cream she was able to swallow that and that soothed it some, but not a lot. Pt states that she is afebrile. Pt states that she is currently trying to eat a banana but it is extremely difficult to swallow it. Pt states that she took Tylenol for pain.

## 2011-09-02 ENCOUNTER — Encounter: Payer: Self-pay | Admitting: Gastroenterology

## 2011-09-04 ENCOUNTER — Encounter: Payer: Self-pay | Admitting: Gastroenterology

## 2011-09-11 ENCOUNTER — Encounter: Payer: Self-pay | Admitting: Gastroenterology

## 2012-01-06 ENCOUNTER — Ambulatory Visit (HOSPITAL_BASED_OUTPATIENT_CLINIC_OR_DEPARTMENT_OTHER): Payer: BC Managed Care – PPO | Admitting: Hematology and Oncology

## 2012-01-06 ENCOUNTER — Telehealth: Payer: Self-pay | Admitting: Hematology and Oncology

## 2012-01-06 ENCOUNTER — Other Ambulatory Visit (HOSPITAL_BASED_OUTPATIENT_CLINIC_OR_DEPARTMENT_OTHER): Payer: BC Managed Care – PPO | Admitting: Lab

## 2012-01-06 ENCOUNTER — Encounter: Payer: Self-pay | Admitting: Hematology and Oncology

## 2012-01-06 VITALS — BP 120/86 | HR 63 | Temp 98.1°F | Ht 63.0 in | Wt 183.9 lb

## 2012-01-06 DIAGNOSIS — I2699 Other pulmonary embolism without acute cor pulmonale: Secondary | ICD-10-CM

## 2012-01-06 DIAGNOSIS — Z7901 Long term (current) use of anticoagulants: Secondary | ICD-10-CM

## 2012-01-06 LAB — CBC WITH DIFFERENTIAL/PLATELET
Basophils Absolute: 0 10*3/uL (ref 0.0–0.1)
EOS%: 3 % (ref 0.0–7.0)
HGB: 12.9 g/dL (ref 11.6–15.9)
LYMPH%: 44.6 % (ref 14.0–49.7)
MCH: 28.6 pg (ref 25.1–34.0)
MCV: 88.9 fL (ref 79.5–101.0)
MONO%: 7.3 % (ref 0.0–14.0)
Platelets: 184 10*3/uL (ref 145–400)
RBC: 4.5 10*6/uL (ref 3.70–5.45)
RDW: 13 % (ref 11.2–14.5)

## 2012-01-06 LAB — COMPREHENSIVE METABOLIC PANEL
Alkaline Phosphatase: 76 U/L (ref 39–117)
BUN: 13 mg/dL (ref 6–23)
Creatinine, Ser: 0.79 mg/dL (ref 0.50–1.10)
Glucose, Bld: 99 mg/dL (ref 70–99)
Sodium: 141 mEq/L (ref 135–145)
Total Bilirubin: 0.2 mg/dL — ABNORMAL LOW (ref 0.3–1.2)
Total Protein: 7.2 g/dL (ref 6.0–8.3)

## 2012-01-06 NOTE — Telephone Encounter (Signed)
appts made and printed for pt aom °

## 2012-01-06 NOTE — Progress Notes (Signed)
CC:   Robyn N. Allyne Gee, M.D.  IDENTIFYING STATEMENT:  Patient is a 56 year old woman with history of pulmonary embolism who presents for followup.  INTERVAL HISTORY:  Mrs. Powell was diagnosed with a pulmonary embolism on December 27, 2010.  She has since been on anticoagulation with Coumadin. She notes occasional bruising doing water aerobics, otherwise appears to have tolerated Coumadin with very minimal difficulties.  She has no current complaints and feels extremely well.  She has received a GI evaluation with Dr. Jarold Motto on August 31, 2011 with an endoscopy and colonoscopy which she reports to me was unremarkable.  Also received a CT scan of the chest, abdomen, and pelvis that was essentially unremarkable without any evidence of malignancy.  MEDICATIONS:  Reviewed and updated.  ALLERGIES:  Clarithromycin and omeprazole and Lipitor.  PHYSICAL EXAM:  General:  Patient is alert, oriented x3.  Vitals:  Pulse 63, blood pressure 120/86, temperature 98.1, respirations 20, weight 183 pounds.  HEENT:  Head is atraumatic, normocephalic.  Sclerae anicteric. Mouth moist.  Neck:  Supple.  Chest:  Clear.  Abdomen:  Soft. Nontender.  Extremities:  No calf tenderness.  Pulses present symmetrical.  LAB DATA:  CBC on 01/06/2012, white cell count 4.6, hemoglobin 12.9, hematocrit 40, platelets 184.  C-MET pending.  IMPRESSION AND PLAN:  Mrs. Rohrig is a 56 year old woman with history of bilateral pulmonary emboli diagnosed in April 2012.  She has been on anticoagulation for a year now.  She has tolerated Coumadin, has no current complaints.  Both CTs and GI studies have been unremarkable.  I will have Mrs. Garramone discontinue Coumadin and in 6 weeks' time we will obtain a full hypercoagulable panel to also include D-dimer factor VIII. She presents to discuss results after this.  In the interim, if she has any respiratory complaints or concerns, she is not to hesitate to Go to the Emergency Room.      ______________________________ Laurice Record, M.D. LIO/MEDQ  D:  01/06/2012  T:  01/06/2012  Job:  [id number]

## 2012-01-06 NOTE — Patient Instructions (Signed)
Patient to follow up as instructed.   Current Outpatient Prescriptions  Medication Sig Dispense Refill  . esomeprazole (NEXIUM) 40 MG capsule Take 1 capsule (40 mg total) by mouth 2 (two) times daily.  60 capsule  6  . Multiple Vitamin (ONE-A-DAY ESSENTIAL PO) Take 1 tablet by mouth daily.        Marland Kitchen warfarin (COUMADIN) 7.5 MG tablet Takes as directed        . metoCLOPramide (REGLAN) 10 MG tablet Take 1 tablet (10 mg total) by mouth at bedtime.  30 tablet  1  . DISCONTD: NEXIUM 40 MG capsule One tablet by mouth as needed             April 2013  Sunday Monday Tuesday Wednesday Thursday Friday Saturday      1   2   3   4   5   6    7   8   9   10   11   12   13    14   15   16   17    LAB MO     2:00 PM  (15 min.)  Windell Hummingbird  [facility]   EST PT 30   2:30 PM  (30 min.)  Laurice Record, MD  [facility] 18   19   20    21   22   23   24   25   26   27    28   29    30

## 2012-01-06 NOTE — Progress Notes (Signed)
This office note has been dictated.

## 2012-02-11 ENCOUNTER — Telehealth: Payer: Self-pay | Admitting: Hematology and Oncology

## 2012-02-11 NOTE — Telephone Encounter (Signed)
pt called and r/s 5/28 lab to 5/31   aom

## 2012-02-16 ENCOUNTER — Other Ambulatory Visit: Payer: BC Managed Care – PPO

## 2012-02-19 ENCOUNTER — Other Ambulatory Visit (HOSPITAL_BASED_OUTPATIENT_CLINIC_OR_DEPARTMENT_OTHER): Payer: BC Managed Care – PPO | Admitting: Lab

## 2012-02-19 DIAGNOSIS — I2699 Other pulmonary embolism without acute cor pulmonale: Secondary | ICD-10-CM

## 2012-02-23 LAB — HYPERCOAGULABLE PANEL, COMPREHENSIVE
AntiThromb III Func: 116 % (ref 76–126)
Anticardiolipin IgA: 1 U/mL
Anticardiolipin IgG: 2 GPL U/mL
Anticardiolipin IgM: 0 [MPL'U]/mL
Beta-2 Glyco I IgG: 14 G Units
Beta-2-Glycoprotein I IgA: 7 A Units
Beta-2-Glycoprotein I IgM: 2 M Units
DRVVT: 39.5 s
Lupus Anticoagulant: NOT DETECTED
PTT Lupus Anticoagulant: 32.4 s (ref 28.0–43.0)
Protein C Activity: 163 % — ABNORMAL HIGH (ref 75–133)
Protein C, Total: 89 % (ref 72–160)
Protein S Activity: 97 % (ref 69–129)
Protein S Total: 87 % (ref 60–150)

## 2012-02-23 LAB — FACTOR 8 ASSAY: Coagulation Factor VIII: 207 % — ABNORMAL HIGH (ref 73–140)

## 2012-03-01 ENCOUNTER — Encounter: Payer: Self-pay | Admitting: Hematology and Oncology

## 2012-03-01 ENCOUNTER — Ambulatory Visit (HOSPITAL_BASED_OUTPATIENT_CLINIC_OR_DEPARTMENT_OTHER): Payer: BC Managed Care – PPO | Admitting: Hematology and Oncology

## 2012-03-01 VITALS — BP 110/63 | HR 68 | Temp 97.4°F | Ht 63.0 in | Wt 181.8 lb

## 2012-03-01 DIAGNOSIS — I2699 Other pulmonary embolism without acute cor pulmonale: Secondary | ICD-10-CM

## 2012-03-01 NOTE — Patient Instructions (Addendum)
Alexa Simpson  [id number]  [facility] Discharge Instructions  RECOMMENDATIONS MADE BY THE CONSULTANT AND ANY TEST RESULTS WILL BE SENT TO YOUR REFERRING DOCTOR.   EXAM FINDINGS BY MD TODAY AND SIGNS AND SYMPTOMS TO REPORT TO CLINIC OR PRIMARY MD:   Your current list of medications are: Current Outpatient Prescriptions  Medication Sig Dispense Refill  . esomeprazole (NEXIUM) 40 MG capsule Take 1 capsule (40 mg total) by mouth 2 (two) times daily.  60 capsule  6  . Multiple Vitamin (ONE-A-DAY ESSENTIAL PO) Take 1 tablet by mouth daily.           INSTRUCTIONS GIVEN AND DISCUSSED:   SPECIAL INSTRUCTIONS/FOLLOW-UP:  See above.  I acknowledge that I have been informed and understand all the instructions given to me and received a copy. I do not have any more questions at this time, but understand that I may call the [facility] at [phone] during business hours should I have any further questions or need assistance in obtaining follow-up care.

## 2012-03-01 NOTE — Progress Notes (Signed)
CC:   Robyn N. Allyne Gee, M.D.  IDENTIFYING STATEMENT:  The patient is a 56 year old woman with history of pulmonary embolism who presents for followup.  INTERIM HISTORY:  The patient discontinued Coumadin after a year of therapy.  She is here to receive results of completed hypercoagulable workup.  Thus far, denies chest pain, shortness of breath.  She feels well. Results are as follows.  02/19/2012 factor 5 and prothrombin gene mutation were negative; lupus anticoagulant was not detected; protein C activity 163%, protein S activity 97%; anticardiolipin IgG, IgA, and IgM antibodies were unremarkable and were 1, 2, and 0 respectively; beta 2 glycoproteins IgG, IgA, and IgM unremarkable at 14, 7, and 2 respectively; D-dimer was low at 0.39; antithrombin 3 was normal at 116%; factor 8 activity was elevated at 207%.  MEDICATIONS:  Reviewed and updated.  PHYSICAL EXAMINATION:  General:  The patient is a well-appearing, well- nourished woman in no distress.  Vitals:  Pulse 68, blood pressure 110/63, temperature 97.4, respirations 20, weight 181.8 pounds.  HEENT: Head is atraumatic, normocephalic.  Sclerae anicteric.  Mouth moist. Chest:  Clear.  Abdomen:  Soft.  Extremities:  No edema.  LABORATORY DATA:  Hypercoagulable workup as above.  IMPRESSION AND PLAN:  Mrs. Oconnor is a 57 year old woman with a history of bilateral pulmonary emboli diagnosed in April 2012.  In general, her hypercoagulable profile is negative.  Factor VIII level was elevated. She does not want to resume anticoagulation. She was informed that she requires prophylactic anticoagulation during periods of high risk such as prolonged bed rest, trauma, orthopedic injuries, long-haul flights, etc. She will begin low dose Asprin if not contraindicated.  She was also reminded that if she were to have a recurrence, anticoagulation would be indefinite.  She voiced understanding.  She follows up  p.r.n.    ______________________________ Laurice Record, M.D. LIO/MEDQ  D:  03/01/2012  T:  03/01/2012  Job:  [id number]

## 2012-03-01 NOTE — Progress Notes (Signed)
This office note has been dictated.

## 2012-03-24 IMAGING — CT CT CHEST W/ CM
2 of 5 series · 17 of 46 positions shown, 19 images · IV contrast ([ID] OMNI 300)
Comparison: CT 03/06/2011

CT CHEST

CLINICAL DATA: Abdominal pain, history of pulmonary embolism.
Severe low back pain radiating to growing.

CT CHEST, ABDOMEN AND PELVIS WITH CONTRAST
TECHNIQUE: Multidetector CT imaging of the chest, abdomen and
pelvis was performed following the standard protocol during bolus
administration of intravenous contrast.
Contrast: 100mL OMNIPAQUE IOHEXOL 300 MG/ML IV SOLN

[Series 2: cap with st · axial · 0.73mm/px · z∈[-652,-107]mm · 14 of 125 slices shown, 16 images]
[im 8/125  soft-tissue]
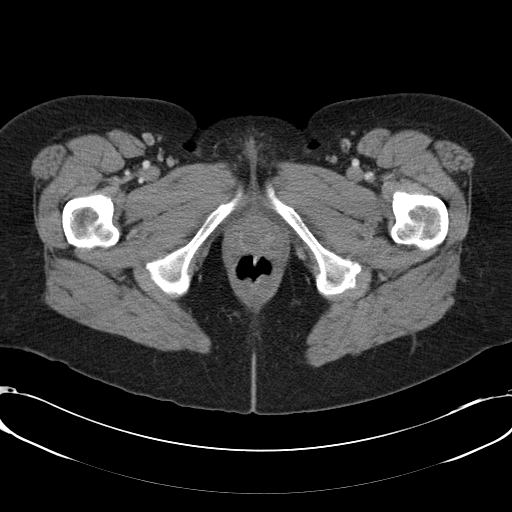
[im 8/125  bone]
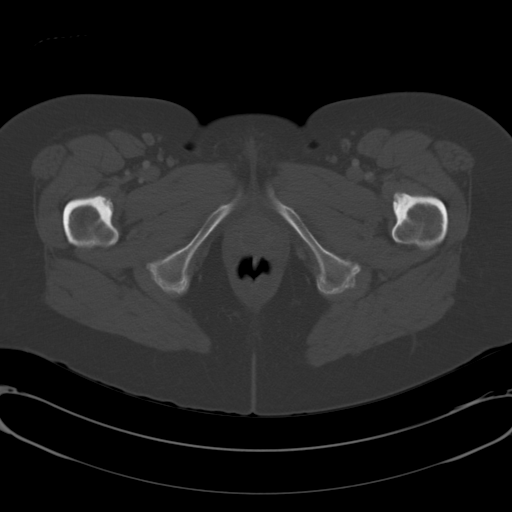
[im 15/125  soft-tissue]
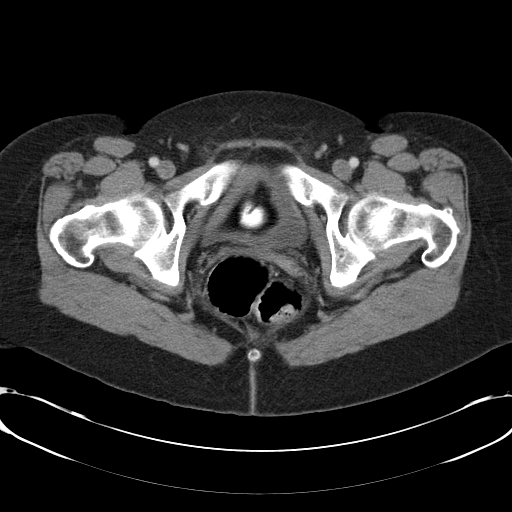
[im 22/125  soft-tissue]
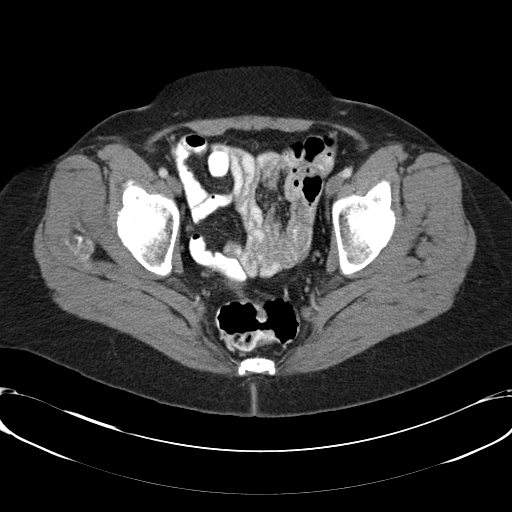
[im 37/125  soft-tissue]
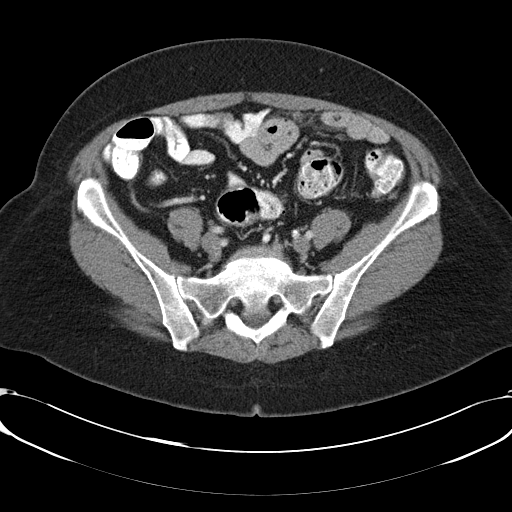
[im 44/125  soft-tissue]
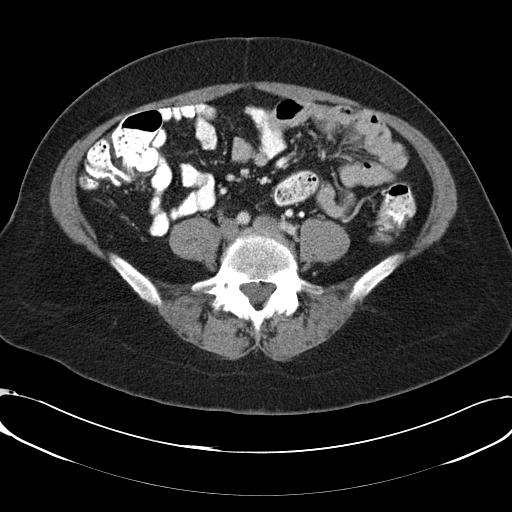
[im 52/125  soft-tissue]
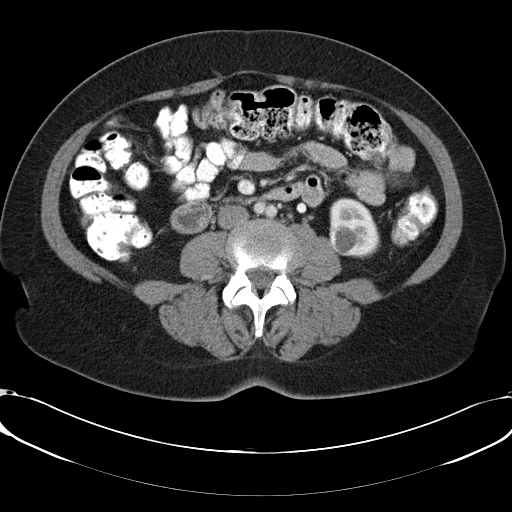
[im 59/125  soft-tissue]
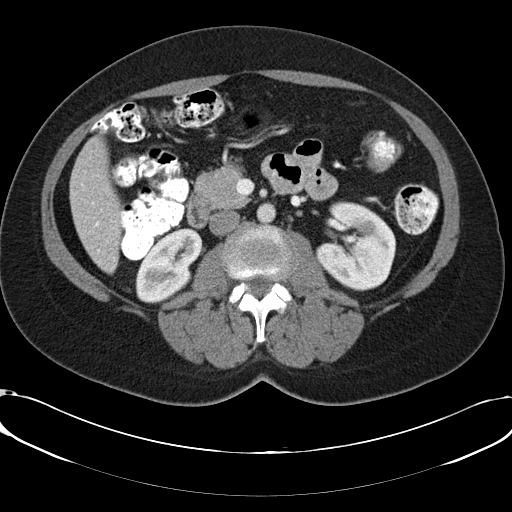
[im 66/125  soft-tissue]
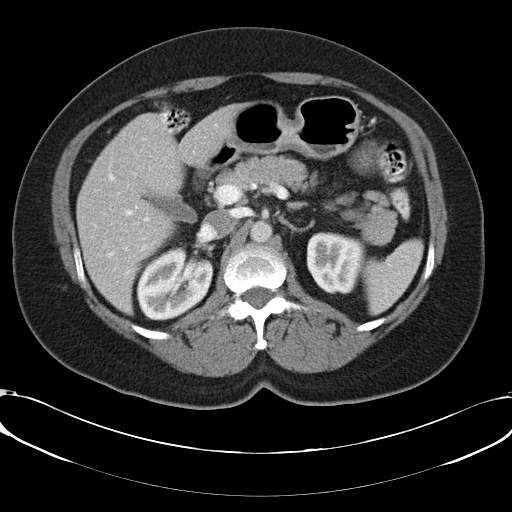
[im 73/125  soft-tissue]
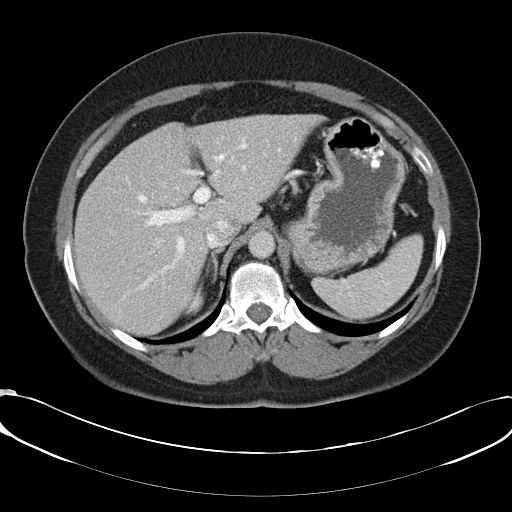
[im 73/125  bone]
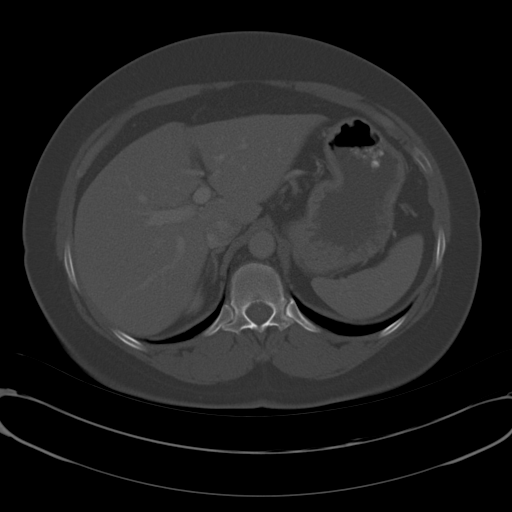
[im 81/125  soft-tissue]
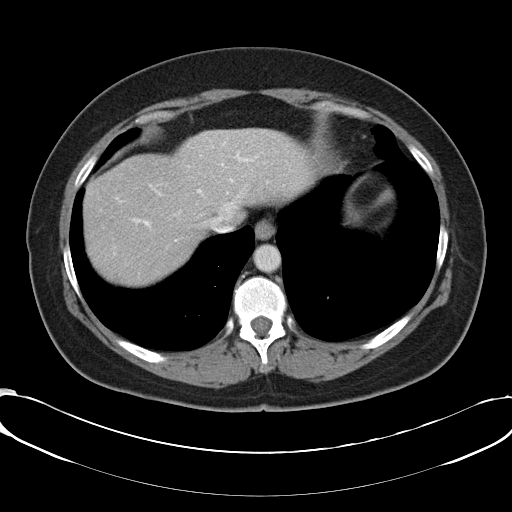
[im 95/125  soft-tissue]
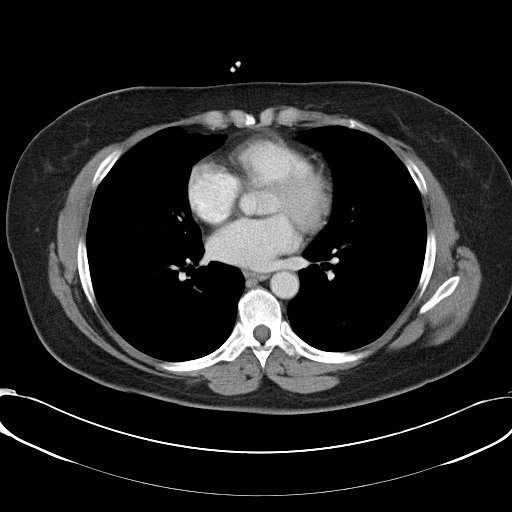
[im 103/125  soft-tissue]
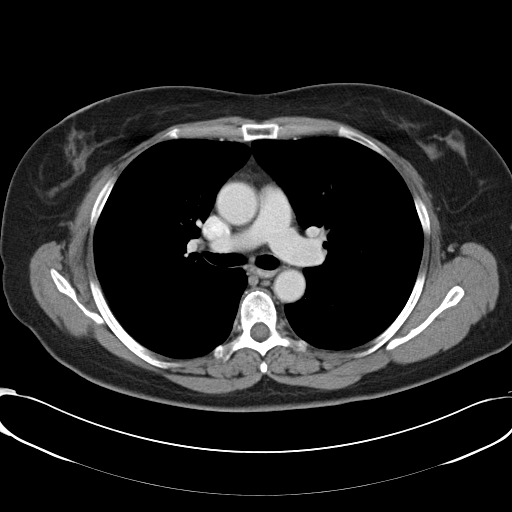
[im 110/125  soft-tissue]
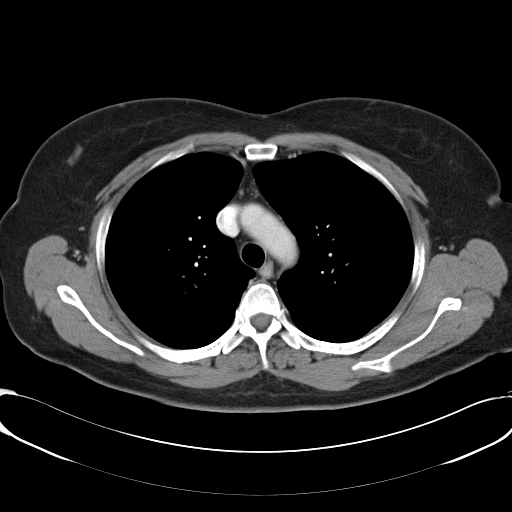
[im 117/125  soft-tissue]
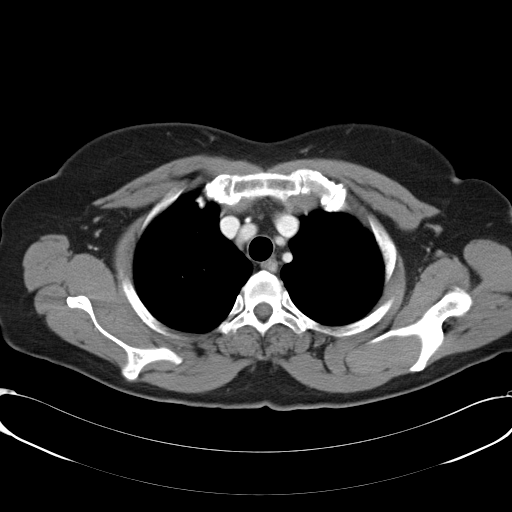

[Series 602: <mpr thick range> · coronal · 1.22mm/px · 3 of 83 slices shown]
[im 28/83  soft-tissue]
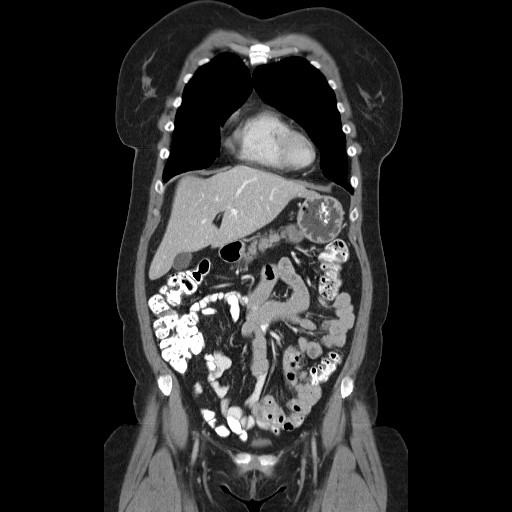
[im 37/83  soft-tissue]
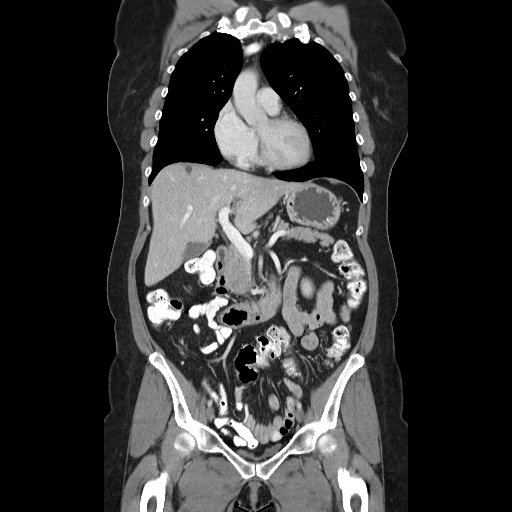
[im 46/83  soft-tissue]
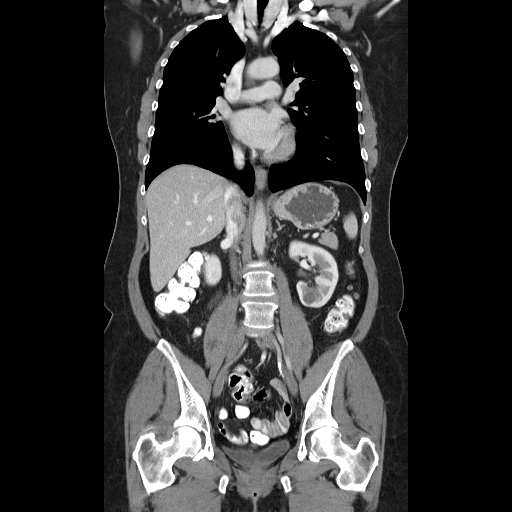

[17 of 46 positions shown; findings below may reference images not displayed]

FINDINGS: No axillary or supraclavicular lymphadenopathy.  No
mediastinal or hilar lymphadenopathy.  Esophagus is normal.  No
gross evidence of pulmonary embolism.

Review of the lung parenchyma demonstrates no pulmonary nodules.
IMPRESSION: No thoracic abnormality.

CT ABDOMEN AND PELVIS
FINDINGS: Low density lesion in the superior left hepatic lobe is
consistent with simple hepatic cyst.  Small lesion in the right
hepatic lobe (image 54) also likely represents a cyst but is too
small to characterize.  The gallbladder, pancreas, spleen, adrenal
glands, kidneys are normal.  There is simple cyst in the left
kidney.

The stomach, small bowel, and colon are normal.  There are several
diverticula of  the descending colon without acute inflammation.

Abdominal aorta normal caliber.  No retroperitoneal periportal
lymphadenopathy.

No free fluid the pelvis.  Bladder is normal.  This hysterectomy
anatomy.  No pelvic lymphadenopathy. Review of  bone windows
demonstrates no aggressive osseous lesions.
IMPRESSION: 1.  No acute abdominal or pelvic process.
2.  Mild descending colon diverticulosis without acute
diverticulitis.
3.  Hysterectomy

## 2013-01-19 ENCOUNTER — Other Ambulatory Visit: Payer: Self-pay | Admitting: Internal Medicine

## 2013-01-19 DIAGNOSIS — Z1231 Encounter for screening mammogram for malignant neoplasm of breast: Secondary | ICD-10-CM

## 2013-08-01 ENCOUNTER — Ambulatory Visit
Admission: RE | Admit: 2013-08-01 | Discharge: 2013-08-01 | Disposition: A | Discharge status: Home / Self Care | Payer: BC Managed Care – PPO | Source: Ambulatory Visit | Attending: Internal Medicine | Admitting: Internal Medicine

## 2013-08-01 DIAGNOSIS — Z1231 Encounter for screening mammogram for malignant neoplasm of breast: Secondary | ICD-10-CM

## 2014-04-30 ENCOUNTER — Other Ambulatory Visit: Payer: Self-pay | Admitting: Internal Medicine

## 2014-04-30 DIAGNOSIS — R079 Chest pain, unspecified: Secondary | ICD-10-CM

## 2014-05-01 ENCOUNTER — Ambulatory Visit
Admission: RE | Admit: 2014-05-01 | Discharge: 2014-05-01 | Disposition: A | Discharge status: Home / Self Care | Payer: BC Managed Care – PPO | Source: Ambulatory Visit | Attending: Internal Medicine | Admitting: Internal Medicine

## 2014-05-01 ENCOUNTER — Inpatient Hospital Stay: Admission: RE | Admit: 2014-05-01 | Payer: BC Managed Care – PPO | Source: Ambulatory Visit

## 2014-05-01 ENCOUNTER — Other Ambulatory Visit: Payer: Self-pay | Admitting: Internal Medicine

## 2014-05-01 DIAGNOSIS — R071 Chest pain on breathing: Secondary | ICD-10-CM

## 2014-05-01 MED ORDER — IOHEXOL 300 MG/ML  SOLN
100.0000 mL | Freq: Once | INTRAMUSCULAR | Status: AC | PRN
  Administered 2014-05-01: 100 mL via INTRAVENOUS

## 2014-07-26 ENCOUNTER — Other Ambulatory Visit: Payer: Self-pay

## 2014-07-26 DIAGNOSIS — Z1231 Encounter for screening mammogram for malignant neoplasm of breast: Secondary | ICD-10-CM

## 2014-08-09 ENCOUNTER — Encounter (INDEPENDENT_AMBULATORY_CARE_PROVIDER_SITE_OTHER): Payer: Self-pay

## 2014-08-09 ENCOUNTER — Ambulatory Visit
Admission: RE | Admit: 2014-08-09 | Discharge: 2014-08-09 | Disposition: A | Discharge status: Home / Self Care | Payer: BC Managed Care – PPO | Source: Ambulatory Visit

## 2014-08-09 DIAGNOSIS — Z1231 Encounter for screening mammogram for malignant neoplasm of breast: Secondary | ICD-10-CM

## 2015-07-09 ENCOUNTER — Other Ambulatory Visit: Payer: Self-pay

## 2015-07-09 DIAGNOSIS — Z1231 Encounter for screening mammogram for malignant neoplasm of breast: Secondary | ICD-10-CM

## 2015-09-12 ENCOUNTER — Ambulatory Visit
Admission: RE | Admit: 2015-09-12 | Discharge: 2015-09-12 | Disposition: A | Discharge status: Home / Self Care | Payer: BC Managed Care – PPO | Source: Ambulatory Visit

## 2015-09-12 DIAGNOSIS — Z1231 Encounter for screening mammogram for malignant neoplasm of breast: Secondary | ICD-10-CM

## 2016-06-18 ENCOUNTER — Encounter: Payer: Self-pay | Admitting: Gastroenterology

## 2016-06-30 ENCOUNTER — Encounter: Payer: Self-pay | Admitting: Gastroenterology

## 2016-08-17 ENCOUNTER — Encounter: Payer: Self-pay | Admitting: Gastroenterology

## 2016-08-17 ENCOUNTER — Other Ambulatory Visit: Payer: Self-pay | Admitting: Family Medicine

## 2016-08-17 ENCOUNTER — Other Ambulatory Visit: Payer: Self-pay | Admitting: Internal Medicine

## 2016-08-17 DIAGNOSIS — Z1231 Encounter for screening mammogram for malignant neoplasm of breast: Secondary | ICD-10-CM

## 2016-09-10 ENCOUNTER — Ambulatory Visit (AMBULATORY_SURGERY_CENTER): Payer: Self-pay | Admitting: *Deleted

## 2016-09-10 VITALS — Ht 63.5 in | Wt 197.0 lb

## 2016-09-10 DIAGNOSIS — Z86010 Personal history of colonic polyps: Secondary | ICD-10-CM

## 2016-09-10 MED ORDER — NA SULFATE-K SULFATE-MG SULF 17.5-3.13-1.6 GM/177ML PO SOLN: 1.0000 | Freq: Once | ORAL | 0 refills | Status: AC

## 2016-09-10 NOTE — Progress Notes (Signed)
No egg or soy allergy. No anesthesia problems.  No home O2.  No diet meds.  

## 2016-09-15 ENCOUNTER — Ambulatory Visit
Admission: RE | Admit: 2016-09-15 | Discharge: 2016-09-15 | Disposition: A | Discharge status: Home / Self Care | Payer: BC Managed Care – PPO | Source: Ambulatory Visit | Attending: Family Medicine | Admitting: Family Medicine

## 2016-09-15 DIAGNOSIS — Z1231 Encounter for screening mammogram for malignant neoplasm of breast: Secondary | ICD-10-CM

## 2016-10-01 ENCOUNTER — Encounter: Payer: Self-pay | Admitting: Gastroenterology

## 2016-10-15 ENCOUNTER — Ambulatory Visit (AMBULATORY_SURGERY_CENTER): Payer: BC Managed Care – PPO | Admitting: Gastroenterology

## 2016-10-15 ENCOUNTER — Encounter: Payer: Self-pay | Admitting: Gastroenterology

## 2016-10-15 VITALS — BP 124/83 | HR 62 | Temp 99.1°F | Resp 15 | Ht 63.5 in | Wt 197.0 lb

## 2016-10-15 DIAGNOSIS — D123 Benign neoplasm of transverse colon: Secondary | ICD-10-CM | POA: Diagnosis not present

## 2016-10-15 DIAGNOSIS — D124 Benign neoplasm of descending colon: Secondary | ICD-10-CM | POA: Diagnosis not present

## 2016-10-15 DIAGNOSIS — Z86010 Personal history of colonic polyps: Secondary | ICD-10-CM | POA: Diagnosis not present

## 2016-10-15 MED ORDER — SODIUM CHLORIDE 0.9 % IV SOLN: 500.0000 mL | INTRAVENOUS | Status: DC

## 2016-10-15 NOTE — Progress Notes (Signed)
Called to room to assist during endoscopic procedure.  Patient ID and intended procedure confirmed with present staff. Received instructions for my participation in the procedure from the performing physician.  

## 2016-10-15 NOTE — Patient Instructions (Signed)
YOU HAD AN ENDOSCOPIC PROCEDURE TODAY AT [facility]:   Refer to the procedure report that was given to you for any specific questions about what was found during the examination.  If the procedure report does not answer your questions, please call your gastroenterologist to clarify.  If you requested that your care partner not be given the details of your procedure findings, then the procedure report has been included in a sealed envelope for you to review at your convenience later.  YOU SHOULD EXPECT: Some feelings of bloating in the abdomen. Passage of more gas than usual.  Walking can help get rid of the air that was put into your GI tract during the procedure and reduce the bloating. If you had a lower endoscopy (such as a colonoscopy or flexible sigmoidoscopy) you may notice spotting of blood in your stool or on the toilet paper. If you underwent a bowel prep for your procedure, you may not have a normal bowel movement for a few days.  Please Note:  You might notice some irritation and congestion in your nose or some drainage.  This is from the oxygen used during your procedure.  There is no need for concern and it should clear up in a day or so.  SYMPTOMS TO REPORT IMMEDIATELY:   Following lower endoscopy (colonoscopy or flexible sigmoidoscopy):  Excessive amounts of blood in the stool  Significant tenderness or worsening of abdominal pains  Swelling of the abdomen that is new, acute  Fever of 100F or higher  For urgent or emergent issues, a gastroenterologist can be reached at any hour by calling [phone].   PLease read all handouts given to you by your recovery nurse. No ibuprofen, NSAIDS or advil for 2 weeks.  DIET:  We do recommend a small meal at first, but then you may proceed to your regular diet.  Drink plenty of fluids but you should avoid alcoholic beverages for 24 hours.  ACTIVITY:  You should plan to take it easy for the rest of today and you should  NOT DRIVE or use heavy machinery until tomorrow (because of the sedation medicines used during the test).    FOLLOW UP: Our staff will call the number listed on your records the next business day following your procedure to check on you and address any questions or concerns that you may have regarding the information given to you following your procedure. If we do not reach you, we will leave a message.  However, if you are feeling well and you are not experiencing any problems, there is no need to return our call.  We will assume that you have returned to your regular daily activities without incident.  If any biopsies were taken you will be contacted by phone or by letter within the next 1-3 weeks.  Please call us at [phone] if you have not heard about the biopsies in 3 weeks.    SIGNATURES/CONFIDENTIALITY: You and/or your care partner have signed paperwork which will be entered into your electronic medical record.  These signatures attest to the fact that that the information above on your After Visit Summary has been reviewed and is understood.  Full responsibility of the confidentiality of this discharge information lies with you and/or your care-partner.  Thank you for letting us take care of your healthcare needs today.

## 2016-10-15 NOTE — Progress Notes (Signed)
A and O x3. Report to RN. Tolerated MAC anesthesia well.

## 2016-10-15 NOTE — Op Note (Signed)
[name] Procedure Date: 10/15/2016 10:45 AM MRN: [id number] Endoscopist: Remo Lipps P. Armbruster MD, MD Age: 61 Referring MD:  Date of Birth: 1956/04/18 Gender: Female Account #: [id number] Procedure:                Colonoscopy Indications:              Surveillance: Personal history of adenomatous                            polyps on last colonoscopy 5 years ago Medicines:                Monitored Anesthesia Care Procedure:                Pre-Anesthesia Assessment:                           - Prior to the procedure, a History and Physical                            was performed, and patient medications and                            allergies were reviewed. The patient's tolerance of                            previous anesthesia was also reviewed. The risks                            and benefits of the procedure and the sedation                            options and risks were discussed with the patient.                            All questions were answered, and informed consent                            was obtained. Prior Anticoagulants: The patient has                            taken no previous anticoagulant or antiplatelet                            agents. ASA Grade Assessment: II - A patient with                            mild systemic disease. After reviewing the risks                            and benefits, the patient was deemed in                            satisfactory condition to undergo the procedure.  After obtaining informed consent, the colonoscope                            was passed under direct vision. Throughout the                            procedure, the patient's blood pressure, pulse, and                            oxygen saturations were monitored continuously. The                            Model PCF-H190L [id number]) scope was introduced                            through the anus  and advanced to the the cecum,                            identified by appendiceal orifice and ileocecal                            valve. The colonoscopy was performed without                            difficulty. The patient tolerated the procedure                            well. The quality of the bowel preparation was                            good. The ileocecal valve, appendiceal orifice, and                            rectum were photographed. Scope In: 10:49:28 AM Scope Out: 11:09:39 AM Scope Withdrawal Time: 0 hours 17 minutes 28 seconds  Total Procedure Duration: 0 hours 20 minutes 11 seconds  Findings:                 The perianal and digital rectal examinations were                            normal.                           Many medium-mouthed diverticula were found in the                            left colon.                           A 4 mm polyp was found in the hepatic flexure. The                            polyp was sessile. The polyp was removed with a  cold snare. Resection and retrieval were complete.                           Three sessile polyps were found in the transverse                            colon. The polyps were 4 to 6 mm in size. These                            polyps were removed with a cold snare. Resection                            and retrieval were complete.                           A 4 mm polyp was found in the descending colon. The                            polyp was sessile. The polyp was removed with a                            cold snare. Resection and retrieval were complete.                           A 4 mm polyp was found in the splenic flexure. The                            polyp was sessile. The polyp was removed with a                            cold snare. Resection and retrieval were complete.                           Internal hemorrhoids were found during retroflexion.                            The exam was otherwise without abnormality. Complications:            No immediate complications. Estimated blood loss:                            Minimal. Estimated Blood Loss:     Estimated blood loss was minimal. Impression:               - Diverticulosis in the left colon.                           - One 4 mm polyp at the hepatic flexure, removed                            with a cold snare. Resected and retrieved.                           -  Three 4 to 6 mm polyps in the transverse colon,                            removed with a cold snare. Resected and retrieved.                           - One 4 mm polyp in the descending colon, removed                            with a cold snare. Resected and retrieved.                           - One 4 mm polyp at the splenic flexure, removed                            with a cold snare. Resected and retrieved.                           - Internal hemorrhoids.                           - The examination was otherwise normal. Recommendation:           - Patient has a contact number available for                            emergencies. The signs and symptoms of potential                            delayed complications were discussed with the                            patient. Return to normal activities tomorrow.                            Written discharge instructions were provided to the                            patient.                           - Resume previous diet.                           - Continue present medications.                           - No ibuprofen, naproxen, or other non-steroidal                            anti-inflammatory drugs for 2 weeks after polyp                            removal.                           -  Await pathology results.                           - Repeat colonoscopy is recommended for                            surveillance. The colonoscopy date will be                            determined after  pathology results from today's                            exam become available for review. Remo Lipps P. Armbruster MD, MD 10/15/2016 11:15:14 AM This report has been signed electronically.

## 2016-10-16 ENCOUNTER — Telehealth: Payer: Self-pay

## 2016-10-16 NOTE — Telephone Encounter (Signed)
  Follow up Call-  Call back number 10/15/2016  Post procedure Call Back phone  # [phone]  Permission to leave phone message Yes  Some recent data might be hidden     Patient questions:  Do you have a fever, pain , or abdominal swelling? No. Pain Score  0 *  Have you tolerated food without any problems? Yes.    Have you been able to return to your normal activities? Yes.    Do you have any questions about your discharge instructions: Diet   No. Medications  No. Follow up visit  No.  Do you have questions or concerns about your Care? No.  Actions: * If pain score is 4 or above: No action needed, pain <4.

## 2016-10-21 ENCOUNTER — Encounter: Payer: Self-pay | Admitting: Gastroenterology

## 2017-08-05 ENCOUNTER — Other Ambulatory Visit: Payer: Self-pay | Admitting: Family Medicine

## 2017-08-05 DIAGNOSIS — Z1231 Encounter for screening mammogram for malignant neoplasm of breast: Secondary | ICD-10-CM

## 2017-09-16 ENCOUNTER — Ambulatory Visit
Admission: RE | Admit: 2017-09-16 | Discharge: 2017-09-16 | Disposition: A | Discharge status: Home / Self Care | Payer: BC Managed Care – PPO | Source: Ambulatory Visit | Attending: Family Medicine | Admitting: Family Medicine

## 2017-09-16 DIAGNOSIS — Z1231 Encounter for screening mammogram for malignant neoplasm of breast: Secondary | ICD-10-CM

## 2018-08-08 ENCOUNTER — Other Ambulatory Visit: Payer: Self-pay | Admitting: Family Medicine

## 2018-08-08 DIAGNOSIS — Z1231 Encounter for screening mammogram for malignant neoplasm of breast: Secondary | ICD-10-CM

## 2018-09-20 ENCOUNTER — Ambulatory Visit
Admission: RE | Admit: 2018-09-20 | Discharge: 2018-09-20 | Disposition: A | Discharge status: Home / Self Care | Payer: BC Managed Care – PPO | Source: Ambulatory Visit | Attending: Family Medicine | Admitting: Family Medicine

## 2018-09-20 DIAGNOSIS — Z1231 Encounter for screening mammogram for malignant neoplasm of breast: Secondary | ICD-10-CM

## 2019-08-11 ENCOUNTER — Other Ambulatory Visit: Payer: Self-pay | Admitting: Family Medicine

## 2019-08-11 DIAGNOSIS — Z1231 Encounter for screening mammogram for malignant neoplasm of breast: Secondary | ICD-10-CM

## 2019-10-04 ENCOUNTER — Ambulatory Visit: Payer: BC Managed Care – PPO

## 2019-11-06 ENCOUNTER — Encounter: Payer: Self-pay | Admitting: Gastroenterology

## 2019-11-13 ENCOUNTER — Other Ambulatory Visit: Payer: Self-pay

## 2019-11-13 ENCOUNTER — Ambulatory Visit (AMBULATORY_SURGERY_CENTER): Payer: Self-pay | Admitting: *Deleted

## 2019-11-13 VITALS — Temp 96.9°F | Ht 64.0 in | Wt 203.0 lb

## 2019-11-13 DIAGNOSIS — Z86010 Personal history of colonic polyps: Secondary | ICD-10-CM

## 2019-11-13 DIAGNOSIS — Z01818 Encounter for other preprocedural examination: Secondary | ICD-10-CM

## 2019-11-13 MED ORDER — SUPREP BOWEL PREP KIT 17.5-3.13-1.6 GM/177ML PO SOLN: 1.0000 | Freq: Once | ORAL | 0 refills | Status: AC

## 2019-11-13 NOTE — Progress Notes (Signed)
No egg or soy allergy known to patient  No issues with past sedation with any surgeries  or procedures, no intubation problems - did wake during EGD  No diet pills per patient No home 02 use per patient  No blood thinners per patient  Pt denies issues with constipation  No A fib or A flutter  EMMI video sent to pt's e mail   Due to the COVID-19 pandemic we are asking patients to follow these guidelines. Please only bring one care partner. Please be aware that your care partner may wait in the car in the parking lot or if they feel like they will be too hot to wait in the car, they may wait in the lobby on the 4th floor. All care partners are required to wear a mask the entire time (we do not have any that we can provide them), they need to practice social distancing, and we will do a Covid check for all patient's and care partners when you arrive. Also we will check their temperature and your temperature. If the care partner waits in their car they need to stay in the parking lot the entire time and we will call them on their cell phone when the patient is ready for discharge so they can bring the car to the front of the building. Also all patient's will need to wear a mask into building.

## 2019-11-22 ENCOUNTER — Ambulatory Visit (INDEPENDENT_AMBULATORY_CARE_PROVIDER_SITE_OTHER): Payer: Medicare HMO

## 2019-11-22 ENCOUNTER — Other Ambulatory Visit: Payer: Self-pay | Admitting: Gastroenterology

## 2019-11-22 DIAGNOSIS — Z1159 Encounter for screening for other viral diseases: Secondary | ICD-10-CM

## 2019-11-22 LAB — SARS CORONAVIRUS 2 (TAT 6-24 HRS): SARS Coronavirus 2: NEGATIVE

## 2019-11-27 ENCOUNTER — Ambulatory Visit (AMBULATORY_SURGERY_CENTER): Payer: Medicare HMO | Admitting: Gastroenterology

## 2019-11-27 ENCOUNTER — Encounter: Payer: Self-pay | Admitting: Gastroenterology

## 2019-11-27 ENCOUNTER — Other Ambulatory Visit: Payer: Self-pay

## 2019-11-27 VITALS — BP 120/70 | HR 64 | Temp 96.6°F | Resp 18 | Ht 64.0 in | Wt 203.0 lb

## 2019-11-27 DIAGNOSIS — K621 Rectal polyp: Secondary | ICD-10-CM | POA: Diagnosis not present

## 2019-11-27 DIAGNOSIS — Z86010 Personal history of colonic polyps: Secondary | ICD-10-CM

## 2019-11-27 DIAGNOSIS — D122 Benign neoplasm of ascending colon: Secondary | ICD-10-CM | POA: Diagnosis not present

## 2019-11-27 MED ORDER — SODIUM CHLORIDE 0.9 % IV SOLN: 500.0000 mL | Freq: Once | INTRAVENOUS | Status: DC

## 2019-11-27 NOTE — Op Note (Signed)
[facility] Patient Name: Alexa Simpson Procedure Date: 11/27/2019 9:46 AM MRN: [id number] Endoscopist: Alexa Simpson , MD Age: 64 Referring MD:  Date of Birth: Jul 06, 1956 Gender: Female Account #: [id number] Procedure:                Colonoscopy Indications:              Surveillance: Personal history of adenomatous                            polyps (6) on last colonoscopy 3 years ago Medicines:                Monitored Anesthesia Care Procedure:                Pre-Anesthesia Assessment:                           - Prior to the procedure, a History and Physical                            was performed, and patient medications and                            allergies were reviewed. The patient's tolerance of                            previous anesthesia was also reviewed. The risks                            and benefits of the procedure and the sedation                            options and risks were discussed with the patient.                            All questions were answered, and informed consent                            was obtained. Prior Anticoagulants: The patient has                            taken no previous anticoagulant or antiplatelet                            agents. ASA Grade Assessment: II - A patient with                            mild systemic disease. After reviewing the risks                            and benefits, the patient was deemed in                            satisfactory condition to undergo the procedure.  After obtaining informed consent, the colonoscope                            was passed under direct vision. Throughout the                            procedure, the patient's blood pressure, pulse, and                            oxygen saturations were monitored continuously. The                            Colonoscope was introduced through the anus and                            advanced to the the  cecum, identified by                            appendiceal orifice and ileocecal valve. The                            colonoscopy was performed without difficulty. The                            patient tolerated the procedure well. The quality                            of the bowel preparation was good. The ileocecal                            valve, appendiceal orifice, and rectum were                            photographed. Scope In: 9:52:23 AM Scope Out: 10:09:32 AM Scope Withdrawal Time: 0 hours 14 minutes 49 seconds  Total Procedure Duration: 0 hours 17 minutes 9 seconds  Findings:                 The perianal and digital rectal examinations were                            normal.                           A 3 mm polyp was found in the ascending colon. The                            polyp was sessile. The polyp was removed with a                            cold snare. Resection and retrieval were complete.                           Many small-mouthed diverticula were found in the  sigmoid colon.                           Internal hemorrhoids were found during retroflexion.                           A few benign diminutive hyperplastic polyps noted                            in the rectum. The exam was otherwise without                            abnormality. Complications:            No immediate complications. Estimated blood loss:                            Minimal. Estimated Blood Loss:     Estimated blood loss was minimal. Impression:               - One 3 mm polyp in the ascending colon, removed                            with a cold snare. Resected and retrieved.                           - Diverticulosis in the sigmoid colon.                           - Internal hemorrhoids.                           - A few diminutive hyperplastic rectal polyps.                           - The examination was otherwise normal. Recommendation:           -  Patient has a contact number available for                            emergencies. The signs and symptoms of potential                            delayed complications were discussed with the                            patient. Return to normal activities tomorrow.                            Written discharge instructions were provided to the                            patient.                           - Resume previous diet.                           -  Continue present medications.                           - Await pathology results. Alexa Lipps P. Alexa Whitebread, MD 11/27/2019 10:14:05 AM This report has been signed electronically.

## 2019-11-27 NOTE — Patient Instructions (Signed)
YOU HAD AN ENDOSCOPIC PROCEDURE TODAY AT THE [facility]:   Refer to the procedure report that was given to you for any specific questions about what was found during the examination.  If the procedure report does not answer your questions, please call your gastroenterologist to clarify.  If you requested that your care partner not be given the details of your procedure findings, then the procedure report has been included in a sealed envelope for you to review at your convenience later.  YOU SHOULD EXPECT: Some feelings of bloating in the abdomen. Passage of more gas than usual.  Walking can help get rid of the air that was put into your GI tract during the procedure and reduce the bloating. If you had a lower endoscopy (such as a colonoscopy or flexible sigmoidoscopy) you may notice spotting of blood in your stool or on the toilet paper. If you underwent a bowel prep for your procedure, you may not have a normal bowel movement for a few days.  Please Note:  You might notice some irritation and congestion in your nose or some drainage.  This is from the oxygen used during your procedure.  There is no need for concern and it should clear up in a day or so.  SYMPTOMS TO REPORT IMMEDIATELY:   Following lower endoscopy (colonoscopy or flexible sigmoidoscopy):  Excessive amounts of blood in the stool  Significant tenderness or worsening of abdominal pains  Swelling of the abdomen that is new, acute  Fever of 100F or higher  For urgent or emergent issues, a gastroenterologist can be reached at any hour by calling [phone]. Do not use MyChart messaging for urgent concerns.    DIET:  We do recommend a small meal at first, but then you may proceed to your regular diet.  Drink plenty of fluids but you should avoid alcoholic beverages for 24 hours.  ACTIVITY:  You should plan to take it easy for the rest of today and you should NOT DRIVE or use heavy machinery until tomorrow (because  of the sedation medicines used during the test).    FOLLOW UP: Our staff will call the number listed on your records 48-72 hours following your procedure to check on you and address any questions or concerns that you may have regarding the information given to you following your procedure. If we do not reach you, we will leave a message.  We will attempt to reach you two times.  During this call, we will ask if you have developed any symptoms of COVID 19. If you develop any symptoms (ie: fever, flu-like symptoms, shortness of breath, cough etc.) before then, please call [phone].  If you test positive for Covid 19 in the 2 weeks post procedure, please call and report this information to us.    If any biopsies were taken you will be contacted by phone or by letter within the next 1-3 weeks.  Please call us at [phone] if you have not heard about the biopsies in 3 weeks.    SIGNATURES/CONFIDENTIALITY: You and/or your care partner have signed paperwork which will be entered into your electronic medical record.  These signatures attest to the fact that that the information above on your After Visit Summary has been reviewed and is understood.  Full responsibility of the confidentiality of this discharge information lies with you and/or your care-partner. 

## 2019-11-27 NOTE — Progress Notes (Signed)
Report given to PACU, vss 

## 2019-11-27 NOTE — Progress Notes (Signed)
Temp by JB, VS by CW   Pt's states no medical or surgical changes since previsit or office visit.

## 2019-11-27 NOTE — Progress Notes (Signed)
Called to room to assist during endoscopic procedure.  Patient ID and intended procedure confirmed with present staff. Received instructions for my participation in the procedure from the performing physician.  

## 2019-11-29 ENCOUNTER — Encounter: Payer: Self-pay | Admitting: Gastroenterology

## 2019-11-29 ENCOUNTER — Telehealth: Payer: Self-pay | Admitting: *Deleted

## 2019-11-29 NOTE — Telephone Encounter (Signed)
  Follow up Call-  Call back number 11/27/2019  Post procedure Call Back phone  # [phone]  Permission to leave phone message Yes  Some recent data might be hidden     Patient questions:  Do you have a fever, pain , or abdominal swelling? No. Pain Score  0 *  Have you tolerated food without any problems? Yes.    Have you been able to return to your normal activities? Yes.    Do you have any questions about your discharge instructions: Diet   No. Medications  No. Follow up visit  No.  Do you have questions or concerns about your Care? No.  Actions: * If pain score is 4 or above: No action needed, pain <4.   1. Have you developed a fever since your procedure? no  2.   Have you had an respiratory symptoms (SOB or cough) since your procedure? no  3.   Have you tested positive for COVID 19 since your procedure no  4.   Have you had any family members/close contacts diagnosed with the COVID 19 since your procedure?  no   If yes to any of these questions please route to Joylene John, RN and Alphonsa Gin, Therapist, sports.

## 2019-12-12 ENCOUNTER — Other Ambulatory Visit: Payer: Self-pay

## 2019-12-12 ENCOUNTER — Ambulatory Visit
Admission: RE | Admit: 2019-12-12 | Discharge: 2019-12-12 | Disposition: A | Discharge status: Home / Self Care | Payer: Medicare HMO | Source: Ambulatory Visit | Attending: Family Medicine | Admitting: Family Medicine

## 2019-12-12 DIAGNOSIS — Z1231 Encounter for screening mammogram for malignant neoplasm of breast: Secondary | ICD-10-CM

## 2020-11-01 ENCOUNTER — Other Ambulatory Visit: Payer: Self-pay | Admitting: Family Medicine

## 2020-11-01 DIAGNOSIS — Z1231 Encounter for screening mammogram for malignant neoplasm of breast: Secondary | ICD-10-CM

## 2020-12-23 ENCOUNTER — Other Ambulatory Visit: Payer: Self-pay

## 2020-12-23 ENCOUNTER — Ambulatory Visit
Admission: RE | Admit: 2020-12-23 | Discharge: 2020-12-23 | Disposition: A | Discharge status: Home / Self Care | Payer: Medicare HMO | Source: Ambulatory Visit | Attending: Family Medicine | Admitting: Family Medicine

## 2020-12-23 DIAGNOSIS — Z1231 Encounter for screening mammogram for malignant neoplasm of breast: Secondary | ICD-10-CM

## 2021-12-01 ENCOUNTER — Other Ambulatory Visit: Payer: Self-pay | Admitting: Family Medicine

## 2021-12-01 DIAGNOSIS — Z1231 Encounter for screening mammogram for malignant neoplasm of breast: Secondary | ICD-10-CM

## 2021-12-25 ENCOUNTER — Ambulatory Visit
Admission: RE | Admit: 2021-12-25 | Discharge: 2021-12-25 | Disposition: A | Discharge status: Home / Self Care | Payer: Medicare HMO | Source: Ambulatory Visit | Attending: Family Medicine | Admitting: Family Medicine

## 2021-12-25 DIAGNOSIS — Z1231 Encounter for screening mammogram for malignant neoplasm of breast: Secondary | ICD-10-CM

## 2022-02-04 ENCOUNTER — Encounter: Payer: Self-pay | Admitting: Physician Assistant

## 2022-02-04 ENCOUNTER — Telehealth: Payer: Self-pay | Admitting: Gastroenterology

## 2022-02-04 NOTE — Telephone Encounter (Addendum)
Alexa Simpson, this patient can be scheduled on Friday, 02/13/22 at 8:10 am with Dr. Havery Moros. Thanks ?

## 2022-02-11 ENCOUNTER — Ambulatory Visit: Payer: Medicare HMO | Admitting: Physician Assistant

## 2022-02-11 ENCOUNTER — Encounter: Payer: Self-pay | Admitting: Physician Assistant

## 2022-02-11 VITALS — BP 152/89 | HR 94 | Resp 18 | Ht 64.0 in | Wt 201.0 lb

## 2022-02-11 DIAGNOSIS — R413 Other amnesia: Secondary | ICD-10-CM | POA: Insufficient documentation

## 2022-02-11 DIAGNOSIS — F422 Mixed obsessional thoughts and acts: Secondary | ICD-10-CM | POA: Diagnosis not present
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
## 2022-02-11 NOTE — Patient Instructions (Signed)
It was a pleasure to see you today at our office.   Recommendations:  Follow up with PCP for OCD and ADD.  If memory becomes worse you may return for further evaluation [facility]      RECOMMENDATIONS FOR ALL PATIENTS WITH MEMORY PROBLEMS: 1. Continue to exercise (Recommend 30 minutes of walking everyday, or 3 hours every week) 2. Increase social interactions - continue going to [place] and enjoy social gatherings with friends and family 3. Eat healthy, avoid fried foods and eat more fruits and vegetables 4. Maintain adequate blood pressure, blood sugar, and blood cholesterol level. Reducing the risk of stroke and cardiovascular disease also helps promoting better memory. 5. Avoid stressful situations. Live a simple life and avoid aggravations. Organize your time and prepare for the next day in anticipation. 6. Sleep well, avoid any interruptions of sleep and avoid any distractions in the bedroom that may interfere with adequate sleep quality 7. Avoid sugar, avoid sweets as there is a strong link between excessive sugar intake, diabetes, and cognitive impairment We discussed the Mediterranean diet, which has been shown to help patients reduce the risk of progressive memory disorders and reduces cardiovascular risk. This includes eating fish, eat fruits and green leafy vegetables, nuts like almonds and hazelnuts, walnuts, and also use olive oil. Avoid fast foods and fried foods as much as possible. Avoid sweets and sugar as sugar use has been linked to worsening of memory function.  There is always a concern of gradual progression of memory problems. If this is the case, then we may need to adjust level of care according to patient needs. Support, both to the patient and caregiver, should then be put into place.      You have been referred for a neuropsychological evaluation (i.e., evaluation of memory and thinking abilities). Please bring someone with you to this appointment if  possible, as it is helpful for the doctor to hear from both you and another adult who knows you well. Please bring eyeglasses and hearing aids if you wear them.    The evaluation will take approximately 3 hours and has two parts:   The first part is a clinical interview with the neuropsychologist (Dr. Melvyn Novas or Dr. Nicole Kindred). During the interview, the neuropsychologist will speak with you and the individual you brought to the appointment.    The second part of the evaluation is testing with the doctor's technician Hinton Dyer or Maudie Mercury). During the testing, the technician will ask you to remember different types of material, solve problems, and answer some questionnaires. Your family member will not be present for this portion of the evaluation.   Please note: We must reserve several hours of the neuropsychologist's time and the psychometrician's time for your evaluation appointment. As such, there is a No-Show fee of $100. If you are unable to attend any of your appointments, please contact our office as soon as possible to reschedule.    FALL PRECAUTIONS: Be cautious when walking. Scan the area for obstacles that may increase the risk of trips and falls. When getting up in the mornings, sit up at the edge of the bed for a few minutes before getting out of bed. Consider elevating the bed at the head end to avoid drop of blood pressure when getting up. Walk always in a well-lit room (use night lights in the walls). Avoid area rugs or power cords from appliances in the middle of the walkways. Use a walker or a cane if necessary and consider physical  therapy for balance exercise. Get your eyesight checked regularly.  FINANCIAL OVERSIGHT: Supervision, especially oversight when making financial decisions or transactions is also recommended.  HOME SAFETY: Consider the safety of the kitchen when operating appliances like stoves, microwave oven, and blender. Consider having supervision and share cooking responsibilities  until no longer able to participate in those. Accidents with firearms and other hazards in the house should be identified and addressed as well.   ABILITY TO BE LEFT ALONE: If patient is unable to contact 911 operator, consider using LifeLine, or when the need is there, arrange for someone to stay with patients. Smoking is a fire hazard, consider supervision or cessation. Risk of wandering should be assessed by caregiver and if detected at any point, supervision and safe proof recommendations should be instituted.  MEDICATION SUPERVISION: Inability to self-administer medication needs to be constantly addressed. Implement a mechanism to ensure safe administration of the medications.   DRIVING: Regarding driving, in patients with progressive memory problems, driving will be impaired. We advise to have someone else do the driving if trouble finding directions or if minor accidents are reported. Independent driving assessment is available to determine safety of driving.   If you are interested in the driving assessment, you can contact the following:  [employer] in [place]  [place] [phone] or [phone]    [place] refers to food and lifestyle choices that are based on the traditions of countries located on the [employer]. This way of eating has been shown to help prevent certain conditions and improve outcomes for people who have chronic diseases, like kidney disease and heart disease. What are tips for following this plan? Lifestyle  Cook and eat meals together with your family, when possible. Drink enough fluid to keep your urine clear or pale yellow. Be physically active every day. This includes: Aerobic exercise like running or swimming. Leisure activities like gardening, walking, or housework. Get 7-8 hours of sleep each  night. If recommended by your health care provider, drink red wine in moderation. This means 1 glass a day for nonpregnant women and 2 glasses a day for men. A glass of wine equals 5 oz (150 mL). Reading food labels  Check the serving size of packaged foods. For foods such as rice and pasta, the serving size refers to the amount of cooked product, not dry. Check the total fat in packaged foods. Avoid foods that have saturated fat or trans fats. Check the ingredients list for added sugars, such as corn syrup. Shopping  At the grocery store, buy most of your food from the areas near the walls of the store. This includes: Fresh fruits and vegetables (produce). Grains, beans, nuts, and seeds. Some of these may be available in unpackaged forms or large amounts (in bulk). Fresh seafood. Poultry and eggs. Low-fat dairy products. Buy whole ingredients instead of prepackaged foods. Buy fresh fruits and vegetables in-season from local farmers markets. Buy frozen fruits and vegetables in resealable bags. If you do not have access to quality fresh seafood, buy precooked frozen shrimp or canned fish, such as tuna, salmon, or sardines. Buy small amounts of raw or cooked vegetables, salads, or olives from the deli or salad bar at your store. Stock your pantry so you always have certain foods on hand, such as olive oil, canned tuna, canned tomatoes, rice, pasta, and beans. Cooking  Cook foods with extra-virgin olive oil  instead of using butter or other vegetable oils. Have meat as a side dish, and have vegetables or grains as your main dish. This means having meat in small portions or adding small amounts of meat to foods like pasta or stew. Use beans or vegetables instead of meat in common dishes like chili or lasagna. Experiment with different cooking methods. Try roasting or broiling vegetables instead of steaming or sauteing them. Add frozen vegetables to soups, stews, pasta, or rice. Add nuts or seeds  for added healthy fat at each meal. You can add these to yogurt, salads, or vegetable dishes. Marinate fish or vegetables using olive oil, lemon juice, garlic, and fresh herbs. Meal planning  Plan to eat 1 vegetarian meal one day each week. Try to work up to 2 vegetarian meals, if possible. Eat seafood 2 or more times a week. Have healthy snacks readily available, such as: Vegetable sticks with hummus. Greek yogurt. Fruit and nut trail mix. Eat balanced meals throughout the week. This includes: Fruit: 2-3 servings a day Vegetables: 4-5 servings a day Low-fat dairy: 2 servings a day Fish, poultry, or lean meat: 1 serving a day Beans and legumes: 2 or more servings a week Nuts and seeds: 1-2 servings a day Whole grains: 6-8 servings a day Extra-virgin olive oil: 3-4 servings a day Limit red meat and sweets to only a few servings a month What are my food choices? Mediterranean diet Recommended Grains: Whole-grain pasta. Brown rice. Bulgar wheat. Polenta. Couscous. Whole-wheat bread. [place]. Vegetables: Artichokes. Beets. Broccoli. Cabbage. Carrots. Eggplant. Green beans. Chard. Kale. Spinach. Onions. Leeks. Peas. Squash. Tomatoes. Peppers. Radishes. Fruits: Apples. Apricots. Avocado. Berries. Bananas. Cherries. Dates. Figs. Grapes. Lemons. Melon. Oranges. Peaches. Plums. Pomegranate. Meats and other protein foods: Beans. Almonds. Sunflower seeds. Pine nuts. Peanuts. [place]. Salmon. Scallops. Shrimp. [place]. Tilapia. Clams. Oysters. Eggs. Dairy: Low-fat milk. Cheese. Greek yogurt. Beverages: Water. Red wine. Herbal tea. Fats and oils: Extra virgin olive oil. Avocado oil. Grape seed oil. Sweets and desserts: [place] yogurt with honey. Baked apples. Poached pears. Trail mix. Seasoning and other foods: Basil. Cilantro. Coriander. Cumin. Mint. Parsley. Sage. Rosemary. Tarragon. Garlic. Oregano. Thyme. Pepper. Balsalmic vinegar. Tahini. Hummus. Tomato sauce. Olives. Mushrooms. Limit  these Grains: Prepackaged pasta or rice dishes. Prepackaged cereal with added sugar. Vegetables: Deep fried potatoes (french fries). Fruits: Fruit canned in syrup. Meats and other protein foods: Beef. Pork. Lamb. Poultry with skin. Hot dogs. Berniece Salines. Dairy: Ice cream. Sour cream. Whole milk. Beverages: Juice. Sugar-sweetened soft drinks. Beer. Liquor and spirits. Fats and oils: Butter. Canola oil. Vegetable oil. Beef fat (tallow). Lard. Sweets and desserts: Cookies. Cakes. Pies. Candy. Seasoning and other foods: Mayonnaise. Premade sauces and marinades. The items listed may not be a complete list. Talk with your dietitian about what dietary choices are right for you. Summary The Mediterranean diet includes both food and lifestyle choices. Eat a variety of fresh fruits and vegetables, beans, nuts, seeds, and whole grains. Limit the amount of red meat and sweets that you eat. Talk with your health care provider about whether it is safe for you to drink red wine in moderation. This means 1 glass a day for nonpregnant women and 2 glasses a day for men. A glass of wine equals 5 oz (150 mL). This information is not intended to replace advice given to you by your health care provider. Make sure you discuss any questions you have with your health care provider. Document Released: 04/30/2016 Document Revised: 06/02/2016 Document Reviewed: 04/30/2016 Elsevier  Interactive Patient Education  2017 [employer].

## 2022-02-13 ENCOUNTER — Encounter: Payer: Self-pay | Admitting: Gastroenterology

## 2022-02-13 ENCOUNTER — Ambulatory Visit: Payer: Medicare HMO | Admitting: Gastroenterology

## 2022-02-13 VITALS — BP 140/82 | HR 80 | Ht 64.0 in | Wt 204.0 lb

## 2022-02-13 DIAGNOSIS — R1013 Epigastric pain: Secondary | ICD-10-CM | POA: Diagnosis not present

## 2022-02-13 DIAGNOSIS — K219 Gastro-esophageal reflux disease without esophagitis: Secondary | ICD-10-CM

## 2022-02-13 DIAGNOSIS — K449 Diaphragmatic hernia without obstruction or gangrene: Secondary | ICD-10-CM | POA: Diagnosis not present

## 2022-02-13 NOTE — Progress Notes (Signed)
HPI :  66 year old female history of colon polyps known to me from prior colonoscopies, history of GERD, hiatal hernia, here for reassessment of epigastric to lower chest discomfort in the setting of history of GERD and hiatal hernia.  She states she has had symptoms of epigastric to lower substernal discomfort ongoing for "several years".  She states sporadically and without clear trigger she will have discomfort in her epigastric to lower substernal area.  This does not radiate anywhere and stays localized in this region.  She feels with the spasm of her esophagus.  She drinks water and this usually immediately resolves it.  This usually occurs very sporadically, can occur every day, or weekly.  Again has been ongoing for years.  She had a particularly bad episode in a grocery store recently where she did not have water on her, eventually got it and drank it and resolved the episode.  It is very short-lived and reliably relieved with drinking something.  Outside of these episodes she does have some baseline reflux, she is prescribed omeprazole 20 mg daily but does not take it much at all.  She has regurgitation of food up into her mouth at times.  She does not have much pyrosis.  She can have symptoms of reflux if she has particular food triggers such as pizza.  At baseline she denies any dysphagia with her meals.  When she has these episodes of discomfort she feels that she cannot swallow very well until she drinks water.  At baseline she denies any nausea or vomiting.  She denies any exertional cardiopulmonary symptoms.  She denies any reproducible symptoms with exertional activity.  Really no getting this deal she was in the she states she had a cardiac work-up several years ago which was negative for the symptoms.  She had an EGD in 2012 showing a 4 to 5 cm hiatal hernia with a GEJ stricture which was dilated with 62 Isabell Jarvis, she states that helped her symptoms at the time.  Denies any weight  loss recently.  Her last endoscopy was in 2012.   Labs in [place] reviewed from 02/03/22  Colonoscopy 11/27/19:- The perianal and digital rectal examinations were normal. - A 3 mm polyp was found in the ascending colon. The polyp was sessile. The polyp was removed with a cold snare. Resection and retrieval were complete. - Many small-mouthed diverticula were found in the sigmoid colon. - Internal hemorrhoids were found during retroflexion. - A few benign diminutive hyperplastic polyps noted in the rectum. The exam was otherwise without abnormality.  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 - NO HIGH GRADE DYSPLASIA OR MALIGNANCY  Repeat exam in 5 years given burden of polyps on colonoscopy 2018   EGD 09/01/2011: 4-5cm HH, stricture at the GEJ which was dilated with 71 Fr Maloney  CT scan abdomen pelvis 2-21: No acute pathology, diverticulosis noted    Past Medical History:  Diagnosis Date   Anxiety    Arthritis    Asthma    Clotting disorder ([facility]) 01/03/2011   PE - off coumadin since 2014   Depression    Fibromyalgia    GERD (gastroesophageal reflux disease)    Hyperlipemia    Neuromuscular disorder ([facility])    FIBROMYALGIA   Pulmonary embolism ([facility]) 12/2010   Upper abdominal pain      Past Surgical History:  Procedure Laterality Date   ABDOMINAL HYSTERECTOMY     BACK SURGERY     Diskectomy  BUNIONECTOMY     COLONOSCOPY     POLYPECTOMY     TONSILLECTOMY     UPPER GASTROINTESTINAL ENDOSCOPY     UPPER GI ENDOSCOPY     Family History  Problem Relation Age of Onset   Coronary artery disease Father    Diabetes Father    Coronary artery disease Mother    Diabetes Mother    Colon polyps Brother    Stomach cancer Paternal Aunt    Colon cancer Neg Hx    Esophageal cancer Neg Hx    Rectal cancer Neg Hx    Breast cancer Neg Hx    Social History   Tobacco Use   Smoking status: Former    Packs/day: 0.50    Years: 46.00    Pack years:  23.00    Types: Cigarettes   Smokeless tobacco: Never  Substance Use Topics   Alcohol use: Yes    Comment: OCC.   Drug use: No   Current Outpatient Medications  Medication Sig Dispense Refill   Acetaminophen (TYLENOL ARTHRITIS PAIN PO) Take by mouth.     Cholecalciferol (VITAMIN D) 125 MCG (5000 UT) CAPS Take by mouth.     Multiple Vitamin (ONE-A-DAY ESSENTIAL PO) Take 1 tablet by mouth daily.       omeprazole (PRILOSEC) 20 MG capsule Take 20 mg by mouth daily.     pravastatin (PRAVACHOL) 20 MG tablet Take 20 mg by mouth daily.     tiZANidine (ZANAFLEX) 2 MG tablet      No current facility-administered medications for this visit.   Allergies  Allergen Reactions   Clarithromycin Swelling and Rash   Lipitor [Atorvastatin Calcium] Other (See Comments)    Severe aching, confused.     Review of Systems: All systems reviewed and negative except where noted in HPI.   Labs reviewed in [place]  Physical Exam: BP 140/82   Pulse 80   Ht '5\' 4"'$  (1.626 m)   Wt 204 lb (92.5 kg)   BMI 35.02 kg/m  Constitutional: Pleasant,well-developed, female in no acute distress. HEENT: Normocephalic and atraumatic. Conjunctivae are normal. No scleral icterus. Neck supple.  Cardiovascular: Normal rate, regular rhythm.  Pulmonary/chest: Effort normal and breath sounds normal.  Abdominal: Soft, nondistended, mild epigastric TTP.  There are no masses palpable.  Extremities: no edema Lymphadenopathy: No cervical adenopathy noted. Neurological: Alert and oriented to person place and time. Skin: Skin is warm and dry. No rashes noted. Psychiatric: Normal mood and affect. Behavior is normal.   ASSESSMENT AND PLAN: 66 year old female here for reassessment of following:  Epigastric to lower substernal discomfort GERD Hiatal hernia  As above, patient says symptoms ongoing for several years now.  Occurs sporadically without any clear triggers, no other cardiopulmonary symptoms.  Symptoms are  very reliably relieved with drinking water.  We discussed differential diagnosis, given drinking fluids reliably relieves this and her longstanding history of symptoms for years, this is almost certainly esophageal in etiology.  Esophageal spasm certainly is possible.  She does have a history of a large hiatal hernia, possible this could have progressed, erosive esophagitis possible if she is not taking her PPI although symptoms rather intermittent for that.  She does have a history of esophageal stricture but has no baseline dysphagia.  We discussed options.  Recommend she take her omeprazole 20 mg twice daily every day to see if this will help reduce the frequency of her episodes.  Otherwise recommend an EGD to further evaluate.  I discussed EGD and  anesthesia with her, risks/benefits, she understands and wants to proceed.  We had an opening to do this next week and she wants to proceed.  Further recommendations pending the results and her course.  Plan: - [facility]GD at the [facility] next week - take omeprazole '20mg'$  BID scheduled until then - will continue to drink fluids PRN to relieve recurrent symptoms  Jolly Mango, MD [facility]

## 2022-02-13 NOTE — Patient Instructions (Addendum)
If you are age 68 or older, your body mass index should be between 23-30. Your Body mass index is 35.02 kg/m. If this is out of the aforementioned range listed, please consider follow up with your Primary Care Provider.  If you are age 79 or younger, your body mass index should be between 19-25. Your Body mass index is 35.02 kg/m. If this is out of the aformentioned range listed, please consider follow up with your Primary Care Provider.   ________________________________________________________  The [facility] providers would like to encourage you to use [facility] to communicate with providers for non-urgent requests or questions.  Due to long hold times on the telephone, sending your provider a message by [facility] may be a faster and more efficient way to get a response.  Please allow 48 business hours for a response.  Please remember that this is for non-urgent requests.  _______________________________________________________  Alexa Simpson have been scheduled for an endoscopy. Please follow written instructions given to you at your visit today. If you use inhalers (even only as needed), please bring them with you on the day of your procedure.   Take omeprazole twice a day.  Thank you for entrusting me with your care and for choosing [facility], Dr. [place]le[place]ah Cellar

## 2022-02-17 ENCOUNTER — Ambulatory Visit (AMBULATORY_SURGERY_CENTER): Payer: Medicare HMO | Admitting: Gastroenterology

## 2022-02-17 ENCOUNTER — Encounter: Payer: Self-pay | Admitting: Gastroenterology

## 2022-02-17 VITALS — BP 128/73 | HR 78 | Temp 96.6°F | Resp 20 | Ht 64.0 in | Wt 204.0 lb

## 2022-02-17 DIAGNOSIS — K449 Diaphragmatic hernia without obstruction or gangrene: Secondary | ICD-10-CM | POA: Diagnosis not present

## 2022-02-17 DIAGNOSIS — K2980 Duodenitis without bleeding: Secondary | ICD-10-CM | POA: Diagnosis not present

## 2022-02-17 DIAGNOSIS — K219 Gastro-esophageal reflux disease without esophagitis: Secondary | ICD-10-CM

## 2022-02-17 DIAGNOSIS — K229 Disease of esophagus, unspecified: Secondary | ICD-10-CM

## 2022-02-17 DIAGNOSIS — R1013 Epigastric pain: Secondary | ICD-10-CM

## 2022-02-17 DIAGNOSIS — K2950 Unspecified chronic gastritis without bleeding: Secondary | ICD-10-CM | POA: Diagnosis not present

## 2022-02-17 MED ORDER — SODIUM CHLORIDE 0.9 % IV SOLN: 500.0000 mL | Freq: Once | INTRAVENOUS | Status: DC

## 2022-02-17 NOTE — Progress Notes (Signed)
History and Physical Interval Note: Patient seen on 02/13/22 - no interval changes since that time. Here for EGD to evaluate the issues listed below. I have discussed risks / benefits and she wishes to proceed, all questions answered.  02/17/2022 9:59 AM  Alexa Simpson  has presented today for endoscopic procedure(s), with the diagnosis of  Encounter Diagnoses  Name Primary?   Abdominal pain, epigastric Yes   Gastroesophageal reflux disease, unspecified whether esophagitis present    Hiatal hernia   .  The various methods of evaluation and treatment have been discussed with the patient and/or family. After consideration of risks, benefits and other options for treatment, the patient has consented to  the endoscopic procedure(s).   The patient's history has been reviewed, patient examined, no change in status, stable for surgery.  I have reviewed the patient's chart and labs.  Questions were answered to the patient's satisfaction.    Jolly Mango, MD [facility]

## 2022-02-17 NOTE — Progress Notes (Signed)
Called to room to assist during endoscopic procedure.  Patient ID and intended procedure confirmed with present staff. Received instructions for my participation in the procedure from the performing physician.  

## 2022-02-17 NOTE — Op Note (Signed)
Alexa Simpson: Alexa Simpson Procedure Date: 02/17/2022 9:30 AM MRN: [id number] Endoscopist: Remo Lipps P. Temprance Wyre , MD Age: 66 Referring MD:  Date of Birth: [birth year] Gender: Female Account #: [id number] Procedure:                Upper GI endoscopy Indications:              episodic lower chest / epigastric pain relieved                            with drinking fluids, history of gastro-esophageal                            reflux disease - reported history of dilation years                            ago which helped symptoms, but she does not have                            any baseline dysphagia Medicines:                Monitored Anesthesia Care Procedure:                Pre-Anesthesia Assessment:                           - Prior to the procedure, a History and Physical                            was performed, and patient medications and                            allergies were reviewed. The patient's tolerance of                            previous anesthesia was also reviewed. The risks                            and benefits of the procedure and the sedation                            options and risks were discussed with the patient.                            All questions were answered, and informed consent                            was obtained. Prior Anticoagulants: The patient has                            taken no previous anticoagulant or antiplatelet                            agents. ASA Grade Assessment: II - A patient with  mild systemic disease. After reviewing the risks                            and benefits, the patient was deemed in                            satisfactory condition to undergo the procedure.                           After obtaining informed consent, the endoscope was                            passed under direct vision. Throughout the                            procedure, the patient's blood  pressure, pulse, and                            oxygen saturations were monitored continuously. The                            Endoscope was introduced through the mouth, and                            advanced to the second part of duodenum. The upper                            GI endoscopy was accomplished without difficulty.                            The patient tolerated the procedure well. Scope In: Scope Out: Findings:                 Esophagogastric landmarks were identified: the                            Z-line was found at 39 cm, the gastroesophageal                            junction was found at 39 cm and the upper extent of                            the gastric folds was found at 41 cm from the                            incisors.                           A 2 cm hiatal hernia was present.                           One benign-appearing, intrinsic stenosis was found.  This stenosis measured less than one cm (in                            length). A TTS dilator was passed through the                            scope. Dilation with an 18-19-20 mm balloon dilator                            was performed to 18 mm and 19 mm after which an                            appropriate mucosal wrent was noted. There was                            slight nodular tissue on one aspect of the                            stenosis, suspect inflammatory but biopsies were                            taken with a cold forceps for histology.                           The exam of the esophagus was otherwise normal.                           Biopsies were taken with a cold forceps in the                            upper third of the esophagus, in the middle third                            of the esophagus and in the lower third of the                            esophagus for histology to rule out eosinophilic                            esophagitis.                            The entire examined stomach was normal. Biopsies                            were taken with a cold forceps for Helicobacter                            pylori testing.                           Nodular mucosa was found in the duodenal bulb -  suspect benign peptic duodenitis, biopsies taken to                            ensure no adenomatous change.                           The exam of the duodenum was otherwise normal. Complications:            No immediate complications. Estimated blood loss:                            Minimal. Estimated Blood Loss:     Estimated blood loss was minimal. Impression:               - Esophagogastric landmarks identified.                           - 2 cm hiatal hernia.                           - Benign-appearing esophageal stenosis. Dilated to                            5m with good result. Biopsied.                           - Normal esophagus otherwise - biopsies taken to                            rule out EoE                           - Normal stomach. Biopsied.                           - Slightly nodular mucosa in the duodenal bulb -                            biopsies taken as above to ensure no adenomatous                            change                           - Normal duodenum otherwise Recommendation:           - Patient has a contact number available for                            emergencies. The signs and symptoms of potential                            delayed complications were discussed with the                            patient. Return to normal activities tomorrow.  Written discharge instructions were provided to the                            patient.                           - Resume previous diet.                           - Continue present medications.                           - Continue trial of omeprazole '20mg'$  twice daily to                            see if that will  help prevent episodes                           - Await pathology results and course post dilation Teo Moede P. Keelan Pomerleau, MD 02/17/2022 10:28:10 AM This report has been signed electronically.

## 2022-02-17 NOTE — Patient Instructions (Addendum)
Await pathology results from biopsies taken today.  Handout on post dilation diet given - clear liquids until 11:30 then soft foods the rest of the day  Continue Omeprazole 20 mg twice a day to see if that will help prevent episodes   YOU HAD AN ENDOSCOPIC PROCEDURE TODAY AT [facility]:   Refer to the procedure report that was given to you for any specific questions about what was found during the examination.  If the procedure report does not answer your questions, please call your gastroenterologist to clarify.  If you requested that your care partner not be given the details of your procedure findings, then the procedure report has been included in a sealed envelope for you to review at your convenience later.  YOU SHOULD EXPECT: Some feelings of bloating in the abdomen. Passage of more gas than usual.  Walking can help get rid of the air that was put into your GI tract during the procedure and reduce the bloating. If you had a lower endoscopy (such as a colonoscopy or flexible sigmoidoscopy) you may notice spotting of blood in your stool or on the toilet paper. If you underwent a bowel prep for your procedure, you may not have a normal bowel movement for a few days.  Please Note:  You might notice some irritation and congestion in your nose or some drainage.  This is from the oxygen used during your procedure.  There is no need for concern and it should clear up in a day or so.  SYMPTOMS TO REPORT IMMEDIATELY:  Following upper endoscopy (EGD)  Vomiting of blood or coffee ground material  New chest pain or pain under the shoulder blades  Painful or persistently difficult swallowing  New shortness of breath  Fever of 100F or higher  Black, tarry-looking stools  For urgent or emergent issues, a gastroenterologist can be reached at any hour by calling [phone]. Do not use MyChart messaging for urgent concerns.    DIET:  Follow a post-dilation diet today, clear liquids  until 11:30 am then soft diet the remainder of the day, tomorrow you may proceed to your regular diet.  Drink plenty of fluids but you should avoid alcoholic beverages for 24 hours.  ACTIVITY:  You should plan to take it easy for the rest of today and you should NOT DRIVE or use heavy machinery until tomorrow (because of the sedation medicines used during the test).    FOLLOW UP: Our staff will call the number listed on your records 48-72 hours following your procedure to check on you and address any questions or concerns that you may have regarding the information given to you following your procedure. If we do not reach you, we will leave a message.  We will attempt to reach you two times.  During this call, we will ask if you have developed any symptoms of COVID 19. If you develop any symptoms (ie: fever, flu-like symptoms, shortness of breath, cough etc.) before then, please call [phone].  If you test positive for Covid 19 in the 2 weeks post procedure, please call and report this information to [place].    If any biopsies were taken you will be contacted by phone or by letter within the next 1-3 weeks.  Please call us at [phone] if you have not heard about the biopsies in 3 weeks.    SIGNATURES/CONFIDENTIALITY: You and/or your care partner have signed paperwork which will be entered into your electronic medical record.  These signatures attest to the fact that that the information above on your After Visit Summary has been reviewed and is understood.  Full responsibility of the confidentiality of this discharge information lies with you and/or your care-partner.  

## 2022-02-17 NOTE — Progress Notes (Signed)
Pt in recovery with monitors in place, VSS. Report given to receiving RN. Bite guard was placed with pt awake to ensure comfort. No dental or soft tissue damage noted. 

## 2022-02-18 ENCOUNTER — Telehealth: Payer: Self-pay

## 2022-02-18 NOTE — Telephone Encounter (Signed)
  Follow up Call-     02/17/2022    9:32 AM 11/27/2019    8:49 AM  Call back number  Post procedure Call Back phone  # [phone]  Permission to leave phone message No Yes     Patient questions:  Do you have a fever, pain , or abdominal swelling? No. Pain Score  0 *  Have you tolerated food without any problems? Yes.    Have you been able to return to your normal activities? Yes.    Do you have any questions about your discharge instructions: Diet   No. Medications  No. Follow up visit  No.  Do you have questions or concerns about your Care? No.  Actions: * If pain score is 4 or above: No action needed, pain <4.

## 2022-02-26 ENCOUNTER — Other Ambulatory Visit: Payer: Self-pay

## 2022-02-26 ENCOUNTER — Telehealth: Payer: Self-pay

## 2022-02-26 DIAGNOSIS — A048 Other specified bacterial intestinal infections: Secondary | ICD-10-CM

## 2022-02-26 MED ORDER — BISMUTH SUBSALICYLATE 262 MG PO TABS
524.0000 mg | ORAL_TABLET | Freq: Four times a day (QID) | ORAL | 0 refills | Status: AC
Start: 2022-02-26 — End: 2022-03-08

## 2022-02-26 MED ORDER — DOXYCYCLINE HYCLATE 100 MG PO CAPS: 100.0000 mg | ORAL_CAPSULE | Freq: Every day | ORAL | 0 refills | Status: AC

## 2022-02-26 MED ORDER — METRONIDAZOLE 500 MG PO TABS
500.0000 mg | ORAL_TABLET | Freq: Three times a day (TID) | ORAL | 0 refills | Status: AC
Start: 2022-02-26 — End: 2022-03-08

## 2022-02-26 MED ORDER — OMEPRAZOLE 40 MG PO CPDR: 40.0000 mg | DELAYED_RELEASE_CAPSULE | Freq: Two times a day (BID) | ORAL | 0 refills | Status: DC

## 2022-02-26 MED ORDER — TETRACYCLINE HCL 500 MG PO CAPS: 500.0000 mg | ORAL_CAPSULE | Freq: Four times a day (QID) | ORAL | 0 refills | Status: DC

## 2022-02-26 NOTE — Telephone Encounter (Signed)
-----   Message from Yetta Flock, MD sent at 02/26/2022  4:40 PM EDT ----- Regarding: RE: H. pylori TX Can use doxycyline '100mg'$  once daily. Thanks  ----- Message ----- From: Yevette Edwards, RN Sent: 02/26/2022   3:03 PM EDT To: Yetta Flock, MD Subject: Lemmie Evens pylori TX                                   Received a fax from the pharmacy that Tetracycline is not on patient's formulary.  Preferred: - Minocycline - Doxycycline Monohydrate Please advise on alternative. Thanks

## 2022-03-03 ENCOUNTER — Ambulatory Visit: Payer: Medicare HMO | Admitting: Gastroenterology

## 2022-03-26 ENCOUNTER — Telehealth: Payer: Self-pay

## 2022-03-26 NOTE — Telephone Encounter (Signed)
Left pt a detailed vm reminding her to start holding her Omeprazole. I advised pt that she is due for the H. Pylori stool test 1 month after finishing treatment which should be around 7/20. I advised pt to call back if she has any questions or concerns.

## 2022-03-26 NOTE — Telephone Encounter (Signed)
-----   Message from Yevette Edwards, RN sent at 02/26/2022 11:51 AM EDT ----- Regarding: PPI Hold Begin holding Omeprazole 7/6 Pt due for H. Pylori stool test on/after 04/09/22

## 2022-03-27 NOTE — Telephone Encounter (Signed)
Spoke with patient. She states that she received my vm. She has been advised to hold Omeprazole for 2 weeks. She knows that I will call her around 7/20 to remind her to come pick up the stool kit. Patient verbalized understanding and had no concerns at the end of the call.

## 2022-04-09 ENCOUNTER — Telehealth: Payer: Self-pay

## 2022-04-09 ENCOUNTER — Other Ambulatory Visit: Payer: Medicare HMO

## 2022-04-09 NOTE — Telephone Encounter (Signed)
Spoke with patient to remind her that she is due for H. Pylori stool test at this time. No appointment is necessary. Patient is aware that she can stop by the lab in the basement at her convenience between 7:30 AM - 5 PM, Monday through Friday to pick up the stool kit. Patient verbalized understanding and had no concerns at the end of the call.

## 2022-04-09 NOTE — Telephone Encounter (Signed)
-----   Message from Yevette Edwards, RN sent at 02/26/2022 11:50 AM EDT ----- Regarding: H. pylori Pt due for H. Pylori stool test on/after 04/09/22 Order in epic

## 2022-04-10 ENCOUNTER — Other Ambulatory Visit: Payer: Medicare HMO

## 2022-04-10 DIAGNOSIS — A048 Other specified bacterial intestinal infections: Secondary | ICD-10-CM

## 2022-04-12 LAB — H. PYLORI ANTIGEN, STOOL: H pylori Ag, Stl: NEGATIVE

## 2023-01-25 ENCOUNTER — Other Ambulatory Visit: Payer: Self-pay | Admitting: Family Medicine

## 2023-01-25 DIAGNOSIS — Z1231 Encounter for screening mammogram for malignant neoplasm of breast: Secondary | ICD-10-CM

## 2023-01-27 ENCOUNTER — Ambulatory Visit: Payer: Medicare HMO

## 2023-02-03 ENCOUNTER — Ambulatory Visit
Admission: RE | Admit: 2023-02-03 | Discharge: 2023-02-03 | Disposition: A | Discharge status: Home / Self Care | Payer: Medicare HMO | Source: Ambulatory Visit | Attending: Family Medicine | Admitting: Family Medicine

## 2023-02-03 DIAGNOSIS — Z1231 Encounter for screening mammogram for malignant neoplasm of breast: Secondary | ICD-10-CM

## 2023-03-10 ENCOUNTER — Encounter: Payer: Self-pay | Admitting: Gastroenterology

## 2023-03-10 ENCOUNTER — Ambulatory Visit: Payer: Medicare HMO | Admitting: Gastroenterology

## 2023-03-10 VITALS — BP 142/90 | HR 79 | Ht 64.0 in | Wt 204.0 lb

## 2023-03-10 DIAGNOSIS — Z791 Long term (current) use of non-steroidal anti-inflammatories (NSAID): Secondary | ICD-10-CM

## 2023-03-10 DIAGNOSIS — R1013 Epigastric pain: Secondary | ICD-10-CM | POA: Diagnosis not present

## 2023-03-10 DIAGNOSIS — R194 Change in bowel habit: Secondary | ICD-10-CM | POA: Diagnosis not present

## 2023-03-10 DIAGNOSIS — K625 Hemorrhage of anus and rectum: Secondary | ICD-10-CM | POA: Diagnosis not present

## 2023-03-10 DIAGNOSIS — K219 Gastro-esophageal reflux disease without esophagitis: Secondary | ICD-10-CM

## 2023-03-10 MED ORDER — CITRUCEL PO POWD: 1.0000 | Freq: Every day | ORAL | Status: AC

## 2023-03-10 MED ORDER — ONDANSETRON 4 MG PO TBDP: 4.0000 mg | ORAL_TABLET | Freq: Three times a day (TID) | ORAL | 0 refills | Status: AC | PRN

## 2023-03-10 MED ORDER — PLENVU 140 G PO SOLR: 1.0000 | Freq: Once | ORAL | 0 refills | Status: AC

## 2023-03-10 NOTE — Progress Notes (Signed)
HPI :  67 year old female here for reassessment for altered bowel habit changes and rectal bleeding.  I last saw her in May 2023 for dysphagia, epigastric pain, GERD.  Her last colonoscopy was in March 2021, she had 1 small polyp at the time.  No inflammatory changes in her colon.  She states about a year ago she had a marked change in her bowel function.  She states she has significant urgency and frequency multiple times per day.  She has a hard time feeling completely evacuated, often using her hands to press on the perianal area to help her relieve herself.  She has had altered stool form associated with some blood at times and more frequently seeing mucus.  She states she has roughly 18 bowel movements per day.  Again she states has been going on for the past year or so but getting worse.  She has had some leakage/incontinence with this and irritation in her perianal area with prolapse of hemorrhoids at times to her symptoms.  She is very embarrassed about this and is tearful in the interview today.  She has not really tried anything for her symptoms yet.  Recall she previously was having some dysphagia and reflux symptoms.  We performed an EGD in May 2023.  She had a small hiatal hernia with GEJ Schatzki ring that was dilated with a balloon, thought to be causing her dysphagia.  She had H. pylori gastritis on last exam.  She was treated with a course of antibiotics.  She had a follow-up H. pylori stool antigen in July 2023 which was negative, confirming H. pylori eradication.  She states the dilation helped for short period of time but her dysphagia has come back to bother her occasionally.  More recently she has had some upper abdominal discomfort that can sometimes be made worse by eating but also just in general bothers her.  She is also had some reflux symptoms that bother her.  Having some nausea but no vomiting.  She has omeprazole 20 mg at home and states it helps when she takes it, but she  does not take it daily and only as needed.  No weight loss.  She most recently had labs at another center, reviewed in epic.  CBC in May shows a normal hemoglobin of 14.6 with WBC of 4.3.  Normal renal function testing, electrolytes, LFTs.   Colonoscopy 11/27/19:- The perianal and digital rectal examinations were normal. - A 3 mm polyp was found in the ascending colon. The polyp was sessile. The polyp was removed with a cold snare. Resection and retrieval were complete. - Many small-mouthed diverticula were found in the sigmoid colon. - Internal hemorrhoids were found during retroflexion. - A few benign diminutive hyperplastic polyps noted in the rectum. The exam was otherwise without abnormality.   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 - NO HIGH GRADE DYSPLASIA OR MALIGNANCY   Repeat exam in 5 years given burden of polyps on colonoscopy 2018   EGD 09/01/2011: 4-5cm HH, stricture at the GEJ which was dilated with 54 Fr Maloney   CT scan abdomen pelvis 2021: No acute pathology, diverticulosis noted   EGD 02/17/22: - Esophagogastric landmarks were identified: the Z-line was found at 39 cm, the gastroesophageal junction was found at 39 cm and the upper extent of the gastric folds was found at 41 cm from the incisors. Findings: - A 2 cm hiatal hernia was present. - One benign-appearing, intrinsic stenosis was found. This stenosis  measured less than one cm (in length). A TTS dilator was passed through the scope. Dilation with an 18-19-20 mm balloon dilator was performed to 18 mm and 19 mm after which an appropriate mucosal wrent was noted. There was slight nodular tissue on one aspect of the stenosis, suspect inflammatory but biopsies were taken with a cold forceps for histology. - The exam of the esophagus was otherwise normal. - Biopsies were taken with a cold forceps in the upper third of the esophagus, in the middle third of the esophagus and in the lower third of the esophagus  for histology to rule out eosinophilic esophagitis. - The entire examined stomach was normal. Biopsies were taken with a cold forceps for Helicobacter pylori testing. - Nodular mucosa was found in the duodenal bulb - suspect benign peptic duodenitis, biopsies taken to ensure no adenomatous change. - The exam of the duodenum was otherwise normal.  1. 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 Surgical [P], gastric antrum and gastric body - GASTRIC ANTRAL AND OXYNTIC MUCOSA WITH HELICOBACTER PYLORI-ASSOCIATED GASTRITIS - HELICOBACTER PYLORI-LIKE ORGANISMS WERE IDENTIFIED ON ROUTINE H&E STAIN 3. 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 Surgical [P], random esophagus - ESOPHAGEAL SQUAMOUS MUCOSA WITH MILD VASCULAR CONGESTION, AND FOCAL SQUAMOUS BALLOONING, SUGGESTIVE OF REFLUX ESOPHAGITIS - NEGATIVE FOR INCREASED INTRAEPITHELIAL EOSINOPHILS  she has an allergy to clarithromycin, in this light recommend the following for H pylori treatment for 10 days   Omeprazole 40mg  BID Bismuth subsalicylate 300mg  4 times daily (pepto bismol) Tetracycline 500mg  QID Flagyl 500mg  TID  H pylori stool antigen negative 03/2022    Past Medical History:  Diagnosis Date   Anxiety    Arthritis    Asthma    Clotting disorder (HCC) 01/03/2011   PE - off coumadin since 2014   Depression    Fibromyalgia    GERD (gastroesophageal reflux disease)    Hyperlipemia    Neuromuscular disorder (HCC)    FIBROMYALGIA   Pulmonary embolism (HCC) 12/2010   Upper abdominal pain      Past Surgical History:  Procedure Laterality Date   ABDOMINAL HYSTERECTOMY     BACK SURGERY     Diskectomy    BUNIONECTOMY     COLONOSCOPY     POLYPECTOMY     TONSILLECTOMY     UPPER GASTROINTESTINAL ENDOSCOPY     UPPER GI  ENDOSCOPY     Family History  Problem Relation Age of Onset   Coronary artery disease Father    Diabetes Father    Coronary artery disease Mother    Diabetes Mother    Colon polyps Brother    Stomach cancer Paternal Aunt    Colon cancer Neg Hx    Esophageal cancer Neg Hx    Rectal cancer Neg Hx    Breast cancer Neg Hx    Social History   Tobacco Use   Smoking status: Former    Packs/day: 0.50    Years: 46.00    Additional pack years: 0.00    Total pack years: 23.00    Types: Cigarettes    Quit date: 01/03/2011    Years since quitting: 12.1   Smokeless tobacco: Never  Vaping Use   Vaping Use: Never used  Substance Use Topics   Alcohol use: Yes    Comment: OCC.   Drug use: No  Current Outpatient Medications  Medication Sig Dispense Refill   Acetaminophen (TYLENOL ARTHRITIS PAIN PO) Take by mouth.     Cholecalciferol (VITAMIN D) 125 MCG (5000 UT) CAPS Take by mouth.     Multiple Vitamin (ONE-A-DAY ESSENTIAL PO) Take 1 tablet by mouth daily.       omeprazole (PRILOSEC) 20 MG capsule Take 20 mg by mouth daily.     pravastatin (PRAVACHOL) 20 MG tablet Take 20 mg by mouth daily.     tiZANidine (ZANAFLEX) 2 MG tablet      No current facility-administered medications for this visit.   Allergies  Allergen Reactions   Clarithromycin Swelling and Rash   Lipitor [Atorvastatin Calcium] Other (See Comments)    Severe aching, confused.     Review of Systems: All systems reviewed and negative except where noted in HPI.   Labs reviewed in Epic  Physical Exam: BP (!) 142/90   Pulse 79   Ht 5\' 4"  (1.626 m)   Wt 204 lb (92.5 kg)   BMI 35.02 kg/m  Constitutional: Pleasant,well-developed, female in no acute distress. Neurological: Alert and oriented to person place and time. Psychiatric: Normal mood and affect. Behavior is normal.   ASSESSMENT: 67 y.o. female here for assessment of the following  1. Altered bowel habits   2. Rectal bleeding   3. Gastroesophageal  reflux disease, unspecified whether esophagitis present   4. Abdominal pain, epigastric   5. NSAID long-term use    Colonoscopy in 2021 largely unremarkable.  Over the past year unfortunately has developed some rather dramatic changes in her bowels, high stool frequency with urgency, passing mucus, occasional blood.  We discussed differential diagnosis. I am recommending a colonoscopy to make sure no evidence of colitis/proctitis that could be driving some of the symptoms.  I recommended a rectal exam today in the office to further evaluate her rectum and bleeding symptoms, which could be due to hemorrhoids, but she was tearful / anxious about this and declined a rectal exam today, understanding she would prefer to have this done while under anesthesia at the time of her colonoscopy.  I discussed risks and benefits of the colonoscopy and anesthesia, she wants to proceed.  I was able to add her onto my schedule for next week to expedite this procedure for her.  Could consider some Citrucel in the interim to try to provide some regularity.  Labs are otherwise reassuring.  If colonoscopy is negative, may need to consider referral to pelvic floor PT/anorectal manometry, again she declined rectal exam today to evaluate her pelvic floor.  Otherwise, she is using NSAIDs routinely and having epigastric pain, worsening reflux.  Recommend she avoid all NSAIDs at this time, she uses only Tylenol as needed for aches and pains.  I recommend she resume her omeprazole 20 mg and take it every day, in fact given her worsening symptoms she may want to take twice daily for a few weeks to see if this helps.  I provided her some Zofran to use as needed for nausea.  She may need a repeat EGD pending her course, however no room to do double procedure more urgently, prefer to get her colonoscopy done first given this is the more pressing issue at the time.  If we need to do an endoscopy pending her course if she is not better we can  do that in the upcoming weeks.  She agrees, all questions answered, further recommendations pending her course.  PLAN: - schedule colonoscopy at the [facility]  ASAP, booked for <  one week - can try some Citrucel in the interim - stop all NSAIDs, use only tylenol PRN for aches / pains - resume omeprazole and take 20mg  BID for a few weeks, then back to once daily if doing better - prescribed Zofran 4mg  OTD every 8 hours PRN  - may need an EGD pending course, but doing colonoscopy first and consider EGD pending her course on meds as outlined.  Harlin Rain, MD [facility]

## 2023-03-10 NOTE — Patient Instructions (Addendum)
If your blood pressure at your visit was 140/90 or greater, please contact your primary care physician to follow up on this. ______________________________________________________  If you are age 69 or older, your body mass index should be between 23-30. Your Body mass index is 35.02 kg/m. If this is out of the aforementioned range listed, please consider follow up with your Primary Care Provider.  If you are age 37 or younger, your body mass index should be between 19-25. Your Body mass index is 35.02 kg/m. If this is out of the aformentioned range listed, please consider follow up with your Primary Care Provider.  ________________________________________________________  The [place] [facility] providers would like to encourage you to use [facility] to communicate with providers for non-urgent requests or questions.  Due to long hold times on the telephone, sending your provider a message by [facility] may be a faster and more efficient way to get a response.  Please allow 48 business hours for a response.  Please remember that this is for non-urgent requests.  _______________________________________________________  Due to recent changes in healthcare laws, you may see the results of your imaging and laboratory studies on MyChart before your provider has had a chance to review them.  We understand that in some cases there may be results that are confusing or concerning to you. Not all laboratory results come back in the same time frame and the provider may be waiting for multiple results in order to interpret others.  Please give [place] 48 hours in order for your provider to thoroughly review all the results before contacting the office for clarification of your results.   You have been scheduled for a colonoscopy. Please follow written instructions given to you at your visit today.  Please pick up your prep supplies at the pharmacy within the next 1-3 days. If you use inhalers (even only as needed), please bring  them with you on the day of your procedure.  Resume omeprazole 20 mg: Take twice a day  Please purchase the following medications over the counter and take as directed: Citrucel: Take once daily  We have sent the following medications to your pharmacy for you to pick up at your convenience: Zofran 4 mg ODT: Dissolve 1 tablet orally every 8 hours as needed  Stop all NSAIDs.  Thank you for entrusting me with your care and for choosing [facility], Dr. Ileene Patrick

## 2023-03-16 ENCOUNTER — Ambulatory Visit: Payer: Medicare HMO | Admitting: Gastroenterology

## 2023-03-16 ENCOUNTER — Encounter: Payer: Self-pay | Admitting: Gastroenterology

## 2023-03-16 VITALS — BP 112/66 | HR 64 | Temp 97.3°F | Resp 15 | Ht 64.0 in | Wt 204.0 lb

## 2023-03-16 DIAGNOSIS — K625 Hemorrhage of anus and rectum: Secondary | ICD-10-CM

## 2023-03-16 DIAGNOSIS — R194 Change in bowel habit: Secondary | ICD-10-CM

## 2023-03-16 DIAGNOSIS — K639 Disease of intestine, unspecified: Secondary | ICD-10-CM

## 2023-03-16 MED ORDER — SODIUM CHLORIDE 0.9 % IV SOLN
500.0000 mL | Freq: Once | INTRAVENOUS | Status: DC
Start: 2023-03-16 — End: 2023-03-16

## 2023-03-16 NOTE — Progress Notes (Signed)
A and O x3. Report to RN. Tolerated MAC anesthesia well. 

## 2023-03-16 NOTE — Progress Notes (Signed)
History and Physical Interval Note: Patient seen on 03/10/23 - no interval changes. Altered bowel habits with urgency, high frequency, mucous, and bleeding. Colonoscopy to rule out IBD and further evaluate. Have discussed risks / benefits and she wishes to proceed.   03/16/2023 7:25 AM  [facility] Nurse  has presented today for endoscopic procedure(s), with the diagnosis of  Encounter Diagnoses  Name Primary?   Altered bowel habits Yes   Rectal bleeding   .  The various methods of evaluation and treatment have been discussed with the patient and/or family. After consideration of risks, benefits and other options for treatment, the patient has consented to  the endoscopic procedure(s).   The patient's history has been reviewed, patient examined, no change in status, stable for surgery.  I have reviewed the patient's chart and labs.  Questions were answered to the patient's satisfaction.    Harlin Rain, MD [facility]

## 2023-03-16 NOTE — Progress Notes (Signed)
Pt's states no medical or surgical changes since previsit or office visit. 

## 2023-03-16 NOTE — Patient Instructions (Addendum)
Resume all of your current medications today.  Read all of the handouts given to you by your recovery room nurse.  YOU HAD AN ENDOSCOPIC PROCEDURE TODAY AT THE [facility]:   Refer to the procedure report that was given to you for any specific questions about what was found during the examination.  If the procedure report does not answer your questions, please call your gastroenterologist to clarify.  If you requested that your care partner not be given the details of your procedure findings, then the procedure report has been included in a sealed envelope for you to review at your convenience later.  YOU SHOULD EXPECT: Some feelings of bloating in the abdomen. Passage of more gas than usual.  Walking can help get rid of the air that was put into your GI tract during the procedure and reduce the bloating. If you had a lower endoscopy (such as a colonoscopy or flexible sigmoidoscopy) you may notice spotting of blood in your stool or on the toilet paper. If you underwent a bowel prep for your procedure, you may not have a normal bowel movement for a few days.  Please Note:  You might notice some irritation and congestion in your nose or some drainage.  This is from the oxygen used during your procedure.  There is no need for concern and it should clear up in a day or so.  SYMPTOMS TO REPORT IMMEDIATELY:  Following lower endoscopy (colonoscopy or flexible sigmoidoscopy):  Excessive amounts of blood in the stool  Significant tenderness or worsening of abdominal pains  Swelling of the abdomen that is new, acute  Fever of 100F or higher   For urgent or emergent issues, a gastroenterologist can be reached at any hour by calling [phone]. Do not use MyChart messaging for urgent concerns.    DIET:  We do recommend a small meal at first, but then you may proceed to your regular diet.  Drink plenty of fluids but you should avoid alcoholic beverages for 24 hours. Try to increase the  fiber in your diet, and drink plenty of water.  Try Citracel every day or benefiber daily.  ACTIVITY:  You should plan to take it easy for the rest of today and you should NOT DRIVE or use heavy machinery until tomorrow (because of the sedation medicines used during the test).    FOLLOW UP: Our staff will call the number listed on your records the next business day following your procedure.  We will call around 7:15- 8:00 am to check on you and address any questions or concerns that you may have regarding the information given to you following your procedure. If we do not reach you, we will leave a message.     If any biopsies were taken you will be contacted by phone or by letter within the next 1-3 weeks.  Please call us at [phone] if you have not heard about the biopsies in 3 weeks.    SIGNATURES/CONFIDENTIALITY: You and/or your care partner have signed paperwork which will be entered into your electronic medical record.  These signatures attest to the fact that that the information above on your After Visit Summary has been reviewed and is understood.  Full responsibility of the confidentiality of this discharge information lies with you and/or your care-partner.

## 2023-03-16 NOTE — Progress Notes (Signed)
Called to room to assist during endoscopic procedure.  Patient ID and intended procedure confirmed with present staff. Received instructions for my participation in the procedure from the performing physician.  

## 2023-03-16 NOTE — Op Note (Signed)
[facility] Patient Name: Alexa Simpson Procedure Date: 03/16/2023 7:32 AM MRN: [id number] Endoscopist: Viviann Spare P. Adela Lank , MD, [id number] Age: 67 Referring MD:  Date of Birth: [birth year] Gender: Female Account #: [id number] Procedure:                Colonoscopy Indications:              Rectal bleeding, Change in bowel habits - urgency,                            increased frequency, mucous - rule out colitis Medicines:                Monitored Anesthesia Care Procedure:                Pre-Anesthesia Assessment:                           - Prior to the procedure, a History and Physical                            was performed, and patient medications and                            allergies were reviewed. The patient's tolerance of                            previous anesthesia was also reviewed. The risks                            and benefits of the procedure and the sedation                            options and risks were discussed with the patient.                            All questions were answered, and informed consent                            was obtained. Prior Anticoagulants: The patient has                            taken no anticoagulant or antiplatelet agents. ASA                            Grade Assessment: II - A patient with mild systemic                            disease. After reviewing the risks and benefits,                            the patient was deemed in satisfactory condition to                            undergo the procedure.  After obtaining informed consent, the colonoscope                            was passed under direct vision. Throughout the                            procedure, the patient's blood pressure, pulse, and                            oxygen saturations were monitored continuously. The                            Olympus PCF-H190DL ([id number]) Colonoscope was                             introduced through the anus and advanced to the the                            terminal ileum, with identification of the                            appendiceal orifice and IC valve. The colonoscopy                            was performed without difficulty. The patient                            tolerated the procedure well. The quality of the                            bowel preparation was good. The terminal ileum,                            ileocecal valve, appendiceal orifice, and rectum                            were photographed. Scope In: 7:36:10 AM Scope Out: 7:51:46 AM Scope Withdrawal Time: 0 hours 12 minutes 21 seconds  Total Procedure Duration: 0 hours 15 minutes 36 seconds  Findings:                 Hemorrhoids were found on perianal exam.                           The terminal ileum appeared normal.                           Multiple small-mouthed diverticula were found in                            the sigmoid colon.                           Very mild patchy erythematous mucosa was found in  the sigmoid colon near areas of diverticulum.                            Biopsies were taken with a cold forceps for                            histology to rule out SCAD.                           Internal hemorrhoids were found during retroflexion.                           The exam was otherwise without abnormality.                           Biopsies for histology were taken with a cold                            forceps from the right colon, left colon and                            transverse colon for evaluation of microscopic                            colitis. Complications:            No immediate complications. Estimated blood loss:                            Minimal. Estimated Blood Loss:     Estimated blood loss was minimal. Impression:               - Hemorrhoids found on perianal exam.                           - The examined portion of the  ileum was normal.                           - Diverticulosis in the sigmoid colon.                           - Erythematous mucosa in the sigmoid colon as                            outlined. Biopsied to rule out SCAD but changes                            very mild, would be atypical to account for all of                            her symptoms.                           - Internal hemorrhoids.                           -  The examination was otherwise normal.                           - Biopsies were taken with a cold forceps from the                            right colon, left colon and transverse colon for                            evaluation of microscopic colitis. Recommendation:           - Patient has a contact number available for                            emergencies. The signs and symptoms of potential                            delayed complications were discussed with the                            patient. Return to normal activities tomorrow.                            Written discharge instructions were provided to the                            patient.                           - Resume previous diet.                           - Continue present medications.                           - Await pathology results with further                            recommendations. Viviann Spare P. Adela Lank, MD 03/16/2023 7:59:35 AM This report has been signed electronically.

## 2023-03-17 ENCOUNTER — Telehealth: Payer: Self-pay | Admitting: *Deleted

## 2023-03-17 NOTE — Telephone Encounter (Signed)
  Follow up Call-    Row Labels 03/16/2023    7:13 AM 02/17/2022    9:32 AM  Call back number   Section Header. No data exists in this row.    Post procedure Call Back phone  #   [phone]  Permission to leave phone message   Yes No     Patient questions:  Do you have a fever, pain , or abdominal swelling? No. Pain Score  0 *  Have you tolerated food without any problems? Yes.    Have you been able to return to your normal activities? Yes.    Do you have any questions about your discharge instructions: Diet   No. Medications  No. Follow up visit  No.  Do you have questions or concerns about your Care? No.  Actions: * If pain score is 4 or above: No action needed, pain <4.

## 2023-12-21 ENCOUNTER — Other Ambulatory Visit: Payer: Self-pay | Admitting: Family Medicine

## 2023-12-21 DIAGNOSIS — Z1231 Encounter for screening mammogram for malignant neoplasm of breast: Secondary | ICD-10-CM

## 2024-01-28 ENCOUNTER — Encounter (HOSPITAL_COMMUNITY): Payer: Self-pay

## 2024-02-04 ENCOUNTER — Ambulatory Visit
Admission: RE | Admit: 2024-02-04 | Discharge: 2024-02-04 | Disposition: A | Discharge status: Home / Self Care | Source: Ambulatory Visit | Attending: Family Medicine | Admitting: Family Medicine

## 2024-02-04 DIAGNOSIS — Z1231 Encounter for screening mammogram for malignant neoplasm of breast: Secondary | ICD-10-CM
